# Patient Record
Sex: Male | Born: 1970 | Race: White | Hispanic: No | Marital: Married | Smoking: Former smoker
Health system: Southern US, Community
[De-identification: ages and names within clinical notes are randomized; demographics above are authoritative.]

## PROBLEM LIST (undated history)

## (undated) DIAGNOSIS — M1990 Unspecified osteoarthritis, unspecified site: Secondary | ICD-10-CM

## (undated) DIAGNOSIS — I10 Essential (primary) hypertension: Secondary | ICD-10-CM

## (undated) DIAGNOSIS — G4730 Sleep apnea, unspecified: Secondary | ICD-10-CM

## (undated) DIAGNOSIS — E785 Hyperlipidemia, unspecified: Secondary | ICD-10-CM

## (undated) DIAGNOSIS — K219 Gastro-esophageal reflux disease without esophagitis: Secondary | ICD-10-CM

## (undated) HISTORY — DX: Hyperlipidemia, unspecified: E78.5

## (undated) HISTORY — DX: Gastro-esophageal reflux disease without esophagitis: K21.9

## (undated) HISTORY — DX: Unspecified osteoarthritis, unspecified site: M19.90

## (undated) HISTORY — DX: Sleep apnea, unspecified: G47.30

## (undated) HISTORY — DX: Essential (primary) hypertension: I10

---

## 2019-02-12 ENCOUNTER — Other Ambulatory Visit: Payer: Self-pay

## 2019-02-12 ENCOUNTER — Encounter: Payer: Self-pay | Admitting: Family Medicine

## 2019-02-12 ENCOUNTER — Ambulatory Visit: Payer: 59 | Admitting: Family Medicine

## 2019-02-12 VITALS — BP 138/84 | HR 70 | Temp 98.2°F | Ht 74.0 in | Wt 297.6 lb

## 2019-02-12 DIAGNOSIS — Z87891 Personal history of nicotine dependence: Secondary | ICD-10-CM

## 2019-02-12 DIAGNOSIS — I10 Essential (primary) hypertension: Secondary | ICD-10-CM | POA: Diagnosis not present

## 2019-02-12 DIAGNOSIS — J302 Other seasonal allergic rhinitis: Secondary | ICD-10-CM | POA: Diagnosis not present

## 2019-02-12 DIAGNOSIS — Z7689 Persons encountering health services in other specified circumstances: Secondary | ICD-10-CM

## 2019-02-12 DIAGNOSIS — M1990 Unspecified osteoarthritis, unspecified site: Secondary | ICD-10-CM | POA: Diagnosis not present

## 2019-02-12 DIAGNOSIS — K219 Gastro-esophageal reflux disease without esophagitis: Secondary | ICD-10-CM | POA: Diagnosis not present

## 2019-02-12 DIAGNOSIS — E785 Hyperlipidemia, unspecified: Secondary | ICD-10-CM | POA: Diagnosis not present

## 2019-02-12 LAB — COMPREHENSIVE METABOLIC PANEL
ALT: 46 U/L (ref 0–53)
AST: 37 U/L (ref 0–37)
Albumin: 4.5 g/dL (ref 3.5–5.2)
Alkaline Phosphatase: 62 U/L (ref 39–117)
BUN: 19 mg/dL (ref 6–23)
CO2: 27 mEq/L (ref 19–32)
Calcium: 9.6 mg/dL (ref 8.4–10.5)
Chloride: 103 mEq/L (ref 96–112)
Creatinine, Ser: 1.07 mg/dL (ref 0.40–1.50)
GFR: 73.77 mL/min (ref >60.00)
Glucose, Bld: 114 mg/dL — ABNORMAL HIGH (ref 70–99)
Potassium: 4.3 mEq/L (ref 3.5–5.1)
Sodium: 137 mEq/L (ref 135–145)
Total Bilirubin: 0.7 mg/dL (ref 0.2–1.2)
Total Protein: 7.4 g/dL (ref 6.0–8.3)

## 2019-02-12 LAB — LIPID PANEL
Cholesterol: 290 mg/dL — ABNORMAL HIGH (ref 0–200)
HDL: 52.5 mg/dL (ref >39.00)
LDL Cholesterol: 213 mg/dL — ABNORMAL HIGH (ref 0–99)
NonHDL: 237.27
Total CHOL/HDL Ratio: 6
Triglycerides: 119 mg/dL (ref 0.0–149.0)
VLDL: 23.8 mg/dL (ref 0.0–40.0)

## 2019-02-12 LAB — CBC
HCT: 45.6 % (ref 39.0–52.0)
Hemoglobin: 15.1 g/dL (ref 13.0–17.0)
MCHC: 33 g/dL (ref 30.0–36.0)
MCV: 92.5 fl (ref 78.0–100.0)
Platelets: 205 10*3/uL (ref 150.0–400.0)
RBC: 4.93 Mil/uL (ref 4.22–5.81)
RDW: 13 % (ref 11.5–15.5)
WBC: 3.9 10*3/uL — ABNORMAL LOW (ref 4.0–10.5)

## 2019-02-12 MED ORDER — OMEPRAZOLE 20 MG PO CPDR: 20.0000 mg | DELAYED_RELEASE_CAPSULE | Freq: Every day | ORAL | 3 refills | Status: DC

## 2019-02-12 MED ORDER — OLMESARTAN MEDOXOMIL-HCTZ 40-25 MG PO TABS: 1.0000 | ORAL_TABLET | Freq: Every day | ORAL | 3 refills | Status: DC

## 2019-02-12 NOTE — Progress Notes (Signed)
Patient presents to clinic today to f/u on chronic conditions and establish care. Pronounced "Smee-gul-ski".  SUBJECTIVE: PMH: Pt is a 48 yo male with pmh sig for arthritis, HTN, GERD, seasonal allergies, HLD. Previously seen in [place], [place].  HTN: -needs refills.  Was on olmesartan-HCTZ 40-25 mg daily -lost rx when moving -since being out of med endorses dull nagging HA at times -was on losartan, but caused LE edema and muscle cramps -lisinopril caused dry cough -not drinking much water.  Drinking 1 L of [place] per day.  Was drinking 2-3 L.  GERD: -Taking omeprazole 20 mg as needed -In the past reflux was so bad that he had liquid melanite with acid in his mouth and nose. -Notes some improvement in symptoms -Orange juice, acidic foods, vodka will give patient symptoms -Snacking for breakfast and may eat a large dinner. -We will drink a whole container of apple juice in 1 sitting.  Arthritis: -History of arthritis in low back and bulging disc. -not taking anything for pain  Former smoker: -smoked 1/2-1.5 ppd from age [age] -quit by cutting down/cold [place]  Seasonal allergies: -symptoms once or twice a yr -may take OTC med prn  H/o HLD: -was suppose to have labs in March, but COVID pandemic happened. -eating at home more.  Has snacks for breakfast, wo't eat lunch, and has a large dinner.  Also snacks prior to bed.  Social hx: Pt is married.  He and his wife moved from [place]. [place], [place] to the area for his job.  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  Pt endorses social EtOH use. Pt denies tobacco or drug use.  Health Maintenance: Dental --[place] [facility] [facility] Immunizations --influenza vaccine 1991.  Patient does not want This visit.  Last TB test 1990 Colonoscopy --2014 Last CPE/03/2018 Dr. Trinna Balloon  Family medical history: Mom-HTN, HLD, diabetes, miscarriage Dad-alcohol abuse,  depression, drug abuse, MI, HTN Sister-Tina, alcohol abuse, asthma, drug abuse, HTN MGM-MI, HTN, Alzheimer's MDM-HTN, stroke   No past medical history on file.  No current outpatient medications on file prior to visit.   No current facility-administered medications on file prior to visit.    Not on File  No family history on file.  Social History   Socioeconomic History  . Marital status: Married    Spouse name: Not on file  . Number of children: Not on file  . Years of education: Not on file  . Highest education level: Not on file  Occupational History  . Not on file  Tobacco Use  . Smoking status: Not on file  Substance and Sexual Activity  . Alcohol use: Not on file  . Drug use: Not on file  . Sexual activity: Not on file  Other Topics Concern  . Not on file  Social History Narrative  . Not on file   Social Determinants of Health   Financial Resource Strain:   . Difficulty of Paying Living Expenses: Not on file  Food Insecurity:   . Worried About Charity fundraiser in the Last Year: Not on file  . Ran Out of Food in the Last Year: Not on file  Transportation Needs:   . Lack of Transportation (Medical): Not on file  . Lack of Transportation (Non-Medical): Not on file  Physical Activity:   . Days of Exercise per Week: Not on file  . Minutes of  Exercise per Session: Not on file  Stress:   . Feeling of Stress : Not on file  Social Connections:   . Frequency of Communication with Friends and Family: Not on file  . Frequency of Social Gatherings with Friends and Family: Not on file  . Attends Religious Services: Not on file  . Active Member of Clubs or Organizations: Not on file  . Attends Banker Meetings: Not on file  . Marital Status: Not on file  Intimate Partner Violence:   . Fear of Current or Ex-Partner: Not on file  . Emotionally Abused: Not on file  . Physically Abused: Not on file  . Sexually Abused: Not on file    ROS General:  Denies fever, chills, night sweats, changes in weight, changes in appetite HEENT: Denies headaches, ear pain, changes in vision, rhinorrhea, sore throat CV: Denies CP, palpitations, SOB, orthopnea Pulm: Denies SOB, cough, wheezing GI: Denies abdominal pain, nausea, vomiting, diarrhea, constipation GU: Denies dysuria, hematuria, frequency, vaginal discharge Msk: Denies muscle cramps, joint pains Neuro: Denies weakness, numbness, tingling Skin: Denies rashes, bruising Psych: Denies depression, anxiety, hallucinations  BP 138/84 (BP Location: Right Arm, Patient Position: Sitting, Cuff Size: Large)   Pulse 70   Temp 98.2 F (36.8 C) (Temporal)   Ht 6\' 2"  (1.88 m)   Wt 297 lb 9.6 oz (135 kg)   SpO2 96%   BMI 38.21 kg/m   Physical Exam Gen. Pleasant, well developed, well-nourished, in NAD HEENT - [place]/AT, PERRL, EOMI, no scleral icterus, no nasal drainage, pharynx without erythema or exudate. TMs normal b/l Lungs: no use of accessory muscles, CTAB, no wheezes, rales or rhonchi Cardiovascular: RRR, No r/g/m, no peripheral edema Abdomen: BS present, soft, nontender,nondistended Musculoskeletal: No deformities, moves all four extremities, no cyanosis or clubbing, normal tone Neuro:  A&Ox3, CN II-XII intact, normal gait Skin:  Warm, dry, intact, no lesions, sun damage  No results found for this or any previous visit (from the past 2160 hour(s)).  Assessment/Plan: Essential hypertension  -elevated -will restart meds.  Refill sent to pharmacy -discussed lifestyle modifications - Plan: CMP, olmesartan-hydrochlorothiazide (BENICAR HCT) 40-25 MG tablet  Hyperlipidemia, unspecified hyperlipidemia type  -lifestyle modifications - Plan: Lipid Panel  Seasonal allergies -OTC meds prn  Gastroesophageal reflux disease, unspecified whether esophagitis present -avoid foods known to cause problems -avoid eating so late at night -given handouts  - Plan: CBC (no diff), omeprazole (PRILOSEC) 20  MG capsule  Encounter to establish care -We reviewed the PMH, PSH, FH, SH, Meds and Allergies. -We provided refills for any medications we will prescribe as needed. -We addressed current concerns per orders and patient instructions. -We have asked for records for pertinent exams, studies, vaccines and notes from previous providers. -We have advised patient to follow up per instructions below.  Arthritis  -discussed supportive care - Plan: CBC (no diff)  Former smoker -smoking cessation >3 min, <10 min -encouraged to continue to refrain from smoking  F/u prn  2161, MD  This note is not being shared with the patient for the following reason: To prevent harm (release of this note would result in harm to the life or physical safety of the patient or another).

## 2019-02-12 NOTE — Patient Instructions (Addendum)
Managing Your Hypertension Hypertension is commonly called high blood pressure. This is when the force of your blood pressing against the walls of your arteries is too strong. Arteries are blood vessels that carry blood from your heart throughout your body. Hypertension forces the heart to work harder to pump blood, and may cause the arteries to become narrow or stiff. Having untreated or uncontrolled hypertension can cause heart attack, stroke, kidney disease, and other problems. What are blood pressure readings? A blood pressure reading consists of a higher number over a lower number. Ideally, your blood pressure should be below 120/80. The first ("top") number is called the systolic pressure. It is a measure of the pressure in your arteries as your heart beats. The second ("bottom") number is called the diastolic pressure. It is a measure of the pressure in your arteries as the heart relaxes. What does my blood pressure reading mean? Blood pressure is classified into four stages. Based on your blood pressure reading, your health care provider may use the following stages to determine what type of treatment you need, if any. Systolic pressure and diastolic pressure are measured in a unit called mm Hg. Normal  Systolic pressure: below 078.  Diastolic pressure: below 80. Elevated  Systolic pressure: 675-449.  Diastolic pressure: below 80. Hypertension stage 1  Systolic pressure: 201-007.  Diastolic pressure: 12-19. Hypertension stage 2  Systolic pressure: 758 or above.  Diastolic pressure: 90 or above. What health risks are associated with hypertension? Managing your hypertension is an important responsibility. Uncontrolled hypertension can lead to:  A heart attack.  A stroke.  A weakened blood vessel (aneurysm).  Heart failure.  Kidney damage.  Eye damage.  Metabolic syndrome.  Memory and concentration problems. What changes can I make to manage my  hypertension? Hypertension can be managed by making lifestyle changes and possibly by taking medicines. Your health care provider will help you make a plan to bring your blood pressure within a normal range. Eating and drinking   Eat a diet that is high in fiber and potassium, and low in salt (sodium), added sugar, and fat. An example eating plan is called the DASH (Dietary Approaches to Stop Hypertension) diet. To eat this way: ? Eat plenty of fresh fruits and vegetables. Try to fill half of your plate at each meal with fruits and vegetables. ? Eat whole grains, such as whole wheat pasta, brown rice, or whole grain bread. Fill about one quarter of your plate with whole grains. ? Eat low-fat diary products. ? Avoid fatty cuts of meat, processed or cured meats, and poultry with skin. Fill about one quarter of your plate with lean proteins such as fish, chicken without skin, beans, eggs, and tofu. ? Avoid premade and processed foods. These tend to be higher in sodium, added sugar, and fat.  Reduce your daily sodium intake. Most people with hypertension should eat less than 1,500 mg of sodium a day.  Limit alcohol intake to no more than 1 drink a day for nonpregnant women and 2 drinks a day for men. One drink equals 12 oz of beer, 5 oz of wine, or 1 oz of hard liquor. Lifestyle  Work with your health care provider to maintain a healthy body weight, or to lose weight. Ask what an ideal weight is for you.  Get at least 30 minutes of exercise that causes your heart to beat faster (aerobic exercise) most days of the week. Activities may include walking, swimming, or biking.  Include exercise  to strengthen your muscles (resistance exercise), such as weight lifting, as part of your weekly exercise routine. Try to do these types of exercises for 30 minutes at least 3 days a week.  Do not use any products that contain nicotine or tobacco, such as cigarettes and e-cigarettes. If you need help quitting,  ask your health care provider.  Control any long-term (chronic) conditions you have, such as high cholesterol or diabetes. Monitoring  Monitor your blood pressure at home as told by your health care provider. Your personal target blood pressure may vary depending on your medical conditions, your age, and other factors.  Have your blood pressure checked regularly, as often as told by your health care provider. Working with your health care provider  Review all the medicines you take with your health care provider because there may be side effects or interactions.  Talk with your health care provider about your diet, exercise habits, and other lifestyle factors that may be contributing to hypertension.  Visit your health care provider regularly. Your health care provider can help you create and adjust your plan for managing hypertension. Will I need medicine to control my blood pressure? Your health care provider may prescribe medicine if lifestyle changes are not enough to get your blood pressure under control, and if:  Your systolic blood pressure is 130 or higher.  Your diastolic blood pressure is 80 or higher. Take medicines only as told by your health care provider. Follow the directions carefully. Blood pressure medicines must be taken as prescribed. The medicine does not work as well when you skip doses. Skipping doses also puts you at risk for problems. Contact a health care provider if:  You think you are having a reaction to medicines you have taken.  You have repeated (recurrent) headaches.  You feel dizzy.  You have swelling in your ankles.  You have trouble with your vision. Get help right away if:  You develop a severe headache or confusion.  You have unusual weakness or numbness, or you feel faint.  You have severe pain in your chest or abdomen.  You vomit repeatedly.  You have trouble breathing. Summary  Hypertension is when the force of blood pumping  through your arteries is too strong. If this condition is not controlled, it may put you at risk for serious complications.  Your personal target blood pressure may vary depending on your medical conditions, your age, and other factors. For most people, a normal blood pressure is less than 120/80.  Hypertension is managed by lifestyle changes, medicines, or both. Lifestyle changes include weight loss, eating a healthy, low-sodium diet, exercising more, and limiting alcohol. This information is not intended to replace advice given to you by your health care provider. Make sure you discuss any questions you have with your health care provider. Document Released: 11/14/2011 Document Revised: 06/13/2018 Document Reviewed: 01/18/2016 Elsevier Patient Education  2020 [employer].  Food Choices for Gastroesophageal Reflux Disease, Adult When you have gastroesophageal reflux disease (GERD), the foods you eat and your eating habits are very important. Choosing the right foods can help ease the discomfort of GERD. Consider working with a diet and nutrition specialist (dietitian) to help you make healthy food choices. What general guidelines should I follow?  Eating plan  Choose healthy foods low in fat, such as fruits, vegetables, whole grains, low-fat dairy products, and lean meat, fish, and poultry.  Eat frequent, small meals instead of three large meals each day. Eat your meals slowly,  in a relaxed setting. Avoid bending over or lying down until 2-3 hours after eating.  Limit high-fat foods such as fatty meats or fried foods.  Limit your intake of oils, butter, and shortening to less than 8 teaspoons each day.  Avoid the following: ? Foods that cause symptoms. These may be different for different people. Keep a food diary to keep track of foods that cause symptoms. ? Alcohol. ? Drinking large amounts of liquid with meals. ? Eating meals during the 2-3 hours before bed.  Cook foods using  methods other than frying. This may include baking, grilling, or broiling. Lifestyle  Maintain a healthy weight. Ask your health care provider what weight is healthy for you. If you need to lose weight, work with your health care provider to do so safely.  Exercise for at least 30 minutes on 5 or more days each week, or as told by your health care provider.  Avoid wearing clothes that fit tightly around your waist and chest.  Do not use any products that contain nicotine or tobacco, such as cigarettes and e-cigarettes. If you need help quitting, ask your health care provider.  Sleep with the head of your bed raised. Use a wedge under the mattress or blocks under the bed frame to raise the head of the bed. What foods are not recommended? The items listed may not be a complete list. Talk with your dietitian about what dietary choices are best for you. Grains Pastries or quick breads with added fat. [place] toast. Vegetables Deep fried vegetables. [place] fries. Any vegetables prepared with added fat. Any vegetables that cause symptoms. For some people this may include tomatoes and tomato products, chili peppers, onions and garlic, and horseradish. Fruits Any fruits prepared with added fat. Any fruits that cause symptoms. For some people this may include citrus fruits, such as oranges, grapefruit, pineapple, and lemons. Meats and other protein foods High-fat meats, such as fatty beef or pork, hot dogs, ribs, ham, sausage, salami and bacon. Fried meat or protein, including fried fish and fried chicken. Nuts and nut butters. Dairy Whole milk and chocolate milk. Sour cream. Cream. Ice cream. Cream cheese. Milk shakes. Beverages Coffee and tea, with or without caffeine. Carbonated beverages. Sodas. Energy drinks. Fruit juice made with acidic fruits (such as orange or grapefruit). Tomato juice. Alcoholic drinks. Fats and oils Butter. Margarine. Shortening. Ghee. Sweets and desserts Chocolate and  cocoa. Donuts. Seasoning and other foods Pepper. Peppermint and spearmint. Any condiments, herbs, or seasonings that cause symptoms. For some people, this may include curry, hot sauce, or vinegar-based salad dressings. Summary  When you have gastroesophageal reflux disease (GERD), food and lifestyle choices are very important to help ease the discomfort of GERD.  Eat frequent, small meals instead of three large meals each day. Eat your meals slowly, in a relaxed setting. Avoid bending over or lying down until 2-3 hours after eating.  Limit high-fat foods such as fatty meat or fried foods. This information is not intended to replace advice given to you by your health care provider. Make sure you discuss any questions you have with your health care provider. Document Released: 02/19/2005 Document Revised: 06/12/2018 Document Reviewed: 02/21/2016 Elsevier Patient Education  2020 [employer]  High Cholesterol  High cholesterol is a condition in which the blood has high levels of a white, waxy, fat-like substance (cholesterol). The human body needs small amounts of cholesterol. The liver makes all the cholesterol that the body needs. Extra (excess) cholesterol comes  from the food that we eat. Cholesterol is carried from the liver by the blood through the blood vessels. If you have high cholesterol, deposits (plaques) may build up on the walls of your blood vessels (arteries). Plaques make the arteries narrower and stiffer. Cholesterol plaques increase your risk for heart attack and stroke. Work with your health care provider to keep your cholesterol levels in a healthy range. What increases the risk? This condition is more likely to develop in people who:  Eat foods that are high in animal fat (saturated fat) or cholesterol.  Are overweight.  Are not getting enough exercise.  Have a family history of high cholesterol. What are the signs or symptoms? There are no symptoms of this  condition. How is this diagnosed? This condition may be diagnosed from the results of a blood test.  If you are older than age 21, your health care provider may check your cholesterol every 4-6 years.  You may be checked more often if you already have high cholesterol or other risk factors for heart disease. The blood test for cholesterol measures:  "Bad" cholesterol (LDL cholesterol). This is the main type of cholesterol that causes heart disease. The desired level for LDL is less than 100.  "Good" cholesterol (HDL cholesterol). This type helps to protect against heart disease by cleaning the arteries and carrying the LDL away. The desired level for HDL is 60 or higher.  Triglycerides. These are fats that the body can store or burn for energy. The desired number for triglycerides is lower than 150.  Total cholesterol. This is a measure of the total amount of cholesterol in your blood, including LDL cholesterol, HDL cholesterol, and triglycerides. A healthy number is less than 200. How is this treated? This condition is treated with diet changes, lifestyle changes, and medicines. Diet changes  This may include eating more whole grains, fruits, vegetables, nuts, and fish.  This may also include cutting back on red meat and foods that have a lot of added sugar. Lifestyle changes  Changes may include getting at least 40 minutes of aerobic exercise 3 times a week. Aerobic exercises include walking, biking, and swimming. Aerobic exercise along with a healthy diet can help you maintain a healthy weight.  Changes may also include quitting smoking. Medicines  Medicines are usually given if diet and lifestyle changes have failed to reduce your cholesterol to healthy levels.  Your health care provider may prescribe a statin medicine. Statin medicines have been shown to reduce cholesterol, which can reduce the risk of heart disease. Follow these instructions at home: Eating and drinking If  told by your health care provider:  Eat chicken (without skin), fish, veal, shellfish, ground [place] breast, and round or loin cuts of red meat.  Do not eat fried foods or fatty meats, such as hot dogs and salami.  Eat plenty of fruits, such as apples.  Eat plenty of vegetables, such as broccoli, potatoes, and carrots.  Eat beans, peas, and lentils.  Eat grains such as barley, rice, couscous, and bulgur wheat.  Eat pasta without cream sauces.  Use skim or nonfat milk, and eat low-fat or nonfat yogurt and cheeses.  Do not eat or drink whole milk, cream, ice cream, egg yolks, or hard cheeses.  Do not eat stick margarine or tub margarines that contain trans fats (also called partially hydrogenated oils).  Do not eat saturated tropical oils, such as coconut oil and palm oil.  Do not eat cakes, cookies, crackers, or  other baked goods that contain trans fats.  General instructions  Exercise as directed by your health care provider. Increase your activity level with activities such as gardening, walking, and taking the stairs.  Take over-the-counter and prescription medicines only as told by your health care provider.  Do not use any products that contain nicotine or tobacco, such as cigarettes and e-cigarettes. If you need help quitting, ask your health care provider.  Keep all follow-up visits as told by your health care provider. This is important. Contact a health care provider if:  You are struggling to maintain a healthy diet or weight.  You need help to start on an exercise program.  You need help to stop smoking. Get help right away if:  You have chest pain.  You have trouble breathing. This information is not intended to replace advice given to you by your health care provider. Make sure you discuss any questions you have with your health care provider. Document Released: 02/19/2005 Document Revised: 02/22/2017 Document Reviewed: 08/20/2015 Elsevier Patient  Education  [place].  Arthritis Arthritis means joint pain. It can also mean joint disease. A joint is a place where bones come together. There are more than 100 types of arthritis. What are the causes? This condition may be caused by:  Wear and tear of a joint. This is the most common cause.  A lot of acid in the blood, which leads to pain in the joint (gout).  Pain and swelling (inflammation) in a joint.  Infection of a joint.  Injuries in the joint.  A reaction to medicines (allergy). In some cases, the cause may not be known. What are the signs or symptoms? Symptoms of this condition include:  Redness at a joint.  Swelling at a joint.  Stiffness at a joint.  Warmth coming from the joint.  A fever.  A feeling of being sick. How is this treated? This condition may be treated with:  Treating the cause, if it is known.  Rest.  Raising (elevating) the joint.  Putting cold or hot packs on the joint.  Medicines to treat symptoms and reduce pain and swelling.  Shots of medicines (cortisone) into the joint. You may also be told to make changes in your life, such as doing exercises and losing weight. Follow these instructions at home: Medicines  Take over-the-counter and prescription medicines only as told by your doctor.  Do not take aspirin for pain if your doctor says that you may have gout. Activity  Rest your joint if your doctor tells you to.  Avoid activities that make the pain worse.  Exercise your joint regularly as told by your doctor. Try doing exercises like: ? Swimming. ? Water aerobics. ? Biking. ? Walking. Managing pain, stiffness, and swelling      If told, put ice on the affected area. ? Put ice in a plastic bag. ? Place a towel between your skin and the bag. ? Leave the ice on for 20 minutes, 2-3 times per day.  If your joint is swollen, raise (elevate) it above the level of your heart if told by your doctor.  If your  joint feels stiff in the morning, try taking a warm shower.  If told, put heat on the affected area. Do this as often as told by your doctor. Use the heat source that your doctor recommends, such as a moist heat pack or a heating pad. If you have diabetes, do not apply heat without asking your  doctor. To apply heat: ? Place a towel between your skin and the heat source. ? Leave the heat on for 20-30 minutes. ? Remove the heat if your skin turns bright red. This is very important if you are unable to feel pain, heat, or cold. You may have a greater risk of getting burned. General instructions  Do not use any products that contain nicotine or tobacco, such as cigarettes, e-cigarettes, and chewing tobacco. If you need help quitting, ask your doctor.  Keep all follow-up visits as told by your doctor. This is important. Contact a doctor if:  The pain gets worse.  You have a fever. Get help right away if:  You have very bad pain in your joint.  You have swelling in your joint.  Your joint is red.  Many joints become painful and swollen.  You have very bad back pain.  Your leg is very weak.  You cannot control your pee (urine) or poop (stool). Summary  Arthritis means joint pain. It can also mean joint disease. A joint is a place where bones come together.  The most common cause of this condition is wear and tear of a joint.  Symptoms of this condition include redness, swelling, or stiffness of the joint.  This condition is treated with rest, raising the joint, medicines, and putting cold or hot packs on the joint.  Follow your doctor's instructions about medicines, activity, exercises, and other home care treatments. This information is not intended to replace advice given to you by your health care provider. Make sure you discuss any questions you have with your health care provider. Document Released: 05/16/2009 Document Revised: 01/27/2018 Document Reviewed:  01/27/2018 Elsevier Patient Education  2020 [employer]

## 2019-03-03 ENCOUNTER — Telehealth: Payer: Self-pay

## 2019-03-03 NOTE — Telephone Encounter (Signed)
Pt. Given results and instructions. Verbalizes understanding. Will call back to schedule lipid recheck when he has his schedule.

## 2019-08-04 ENCOUNTER — Other Ambulatory Visit: Payer: Self-pay

## 2019-08-04 ENCOUNTER — Emergency Department (HOSPITAL_COMMUNITY): Payer: 59

## 2019-08-04 ENCOUNTER — Observation Stay (HOSPITAL_COMMUNITY)
Admission: EM | Admit: 2019-08-04 | Discharge: 2019-08-05 | Disposition: A | Discharge status: Home / Self Care | Payer: 59 | Attending: Internal Medicine | Admitting: Internal Medicine

## 2019-08-04 ENCOUNTER — Encounter (HOSPITAL_COMMUNITY): Payer: Self-pay | Admitting: Emergency Medicine

## 2019-08-04 DIAGNOSIS — Z20822 Contact with and (suspected) exposure to covid-19: Secondary | ICD-10-CM | POA: Insufficient documentation

## 2019-08-04 DIAGNOSIS — K219 Gastro-esophageal reflux disease without esophagitis: Secondary | ICD-10-CM | POA: Diagnosis not present

## 2019-08-04 DIAGNOSIS — E669 Obesity, unspecified: Secondary | ICD-10-CM | POA: Insufficient documentation

## 2019-08-04 DIAGNOSIS — Z6836 Body mass index (BMI) 36.0-36.9, adult: Secondary | ICD-10-CM | POA: Insufficient documentation

## 2019-08-04 DIAGNOSIS — R55 Syncope and collapse: Principal | ICD-10-CM | POA: Insufficient documentation

## 2019-08-04 DIAGNOSIS — E785 Hyperlipidemia, unspecified: Secondary | ICD-10-CM | POA: Diagnosis not present

## 2019-08-04 DIAGNOSIS — N179 Acute kidney failure, unspecified: Secondary | ICD-10-CM | POA: Insufficient documentation

## 2019-08-04 DIAGNOSIS — R0789 Other chest pain: Secondary | ICD-10-CM | POA: Insufficient documentation

## 2019-08-04 DIAGNOSIS — Z87891 Personal history of nicotine dependence: Secondary | ICD-10-CM | POA: Insufficient documentation

## 2019-08-04 DIAGNOSIS — Z8249 Family history of ischemic heart disease and other diseases of the circulatory system: Secondary | ICD-10-CM | POA: Diagnosis not present

## 2019-08-04 DIAGNOSIS — R739 Hyperglycemia, unspecified: Secondary | ICD-10-CM | POA: Diagnosis not present

## 2019-08-04 DIAGNOSIS — I10 Essential (primary) hypertension: Secondary | ICD-10-CM | POA: Insufficient documentation

## 2019-08-04 DIAGNOSIS — Z66 Do not resuscitate: Secondary | ICD-10-CM | POA: Diagnosis not present

## 2019-08-04 DIAGNOSIS — Z79899 Other long term (current) drug therapy: Secondary | ICD-10-CM | POA: Diagnosis not present

## 2019-08-04 DIAGNOSIS — R079 Chest pain, unspecified: Secondary | ICD-10-CM

## 2019-08-04 LAB — BASIC METABOLIC PANEL
Anion gap: 18 — ABNORMAL HIGH (ref 5–15)
BUN: 26 mg/dL — ABNORMAL HIGH (ref 6–20)
CO2: 20 mmol/L — ABNORMAL LOW (ref 22–32)
Calcium: 10.3 mg/dL (ref 8.9–10.3)
Chloride: 98 mmol/L (ref 98–111)
Creatinine, Ser: 1.81 mg/dL — ABNORMAL HIGH (ref 0.61–1.24)
GFR calc Af Amer: 50 mL/min — ABNORMAL LOW (ref >60)
GFR calc non Af Amer: 43 mL/min — ABNORMAL LOW (ref >60)
Glucose, Bld: 158 mg/dL — ABNORMAL HIGH (ref 70–99)
Potassium: 4.1 mmol/L (ref 3.5–5.1)
Sodium: 136 mmol/L (ref 135–145)

## 2019-08-04 LAB — CBC
HCT: 53.4 % — ABNORMAL HIGH (ref 39.0–52.0)
Hemoglobin: 18.1 g/dL — ABNORMAL HIGH (ref 13.0–17.0)
MCH: 30.2 pg (ref 26.0–34.0)
MCHC: 33.9 g/dL (ref 30.0–36.0)
MCV: 89 fL (ref 80.0–100.0)
Platelets: 279 10*3/uL (ref 150–400)
RBC: 6 MIL/uL — ABNORMAL HIGH (ref 4.22–5.81)
RDW: 12.7 % (ref 11.5–15.5)
WBC: 10.9 10*3/uL — ABNORMAL HIGH (ref 4.0–10.5)
nRBC: 0 % (ref 0.0–0.2)

## 2019-08-04 LAB — TROPONIN I (HIGH SENSITIVITY)
Troponin I (High Sensitivity): 22 ng/L — ABNORMAL HIGH (ref <18)
Troponin I (High Sensitivity): 18 ng/L — ABNORMAL HIGH (ref <18)

## 2019-08-04 MED ORDER — SODIUM CHLORIDE 0.9 % IV SOLN: Freq: Once | INTRAVENOUS | Status: AC

## 2019-08-04 MED ORDER — SODIUM CHLORIDE 0.9% FLUSH
3.0000 mL | Freq: Once | INTRAVENOUS | Status: AC
  Administered 2019-08-05: 3 mL via INTRAVENOUS

## 2019-08-04 MED ORDER — SODIUM CHLORIDE 0.9 % IV BOLUS
1000.0000 mL | Freq: Once | INTRAVENOUS | Status: AC
  Administered 2019-08-05: 1000 mL via INTRAVENOUS

## 2019-08-04 NOTE — ED Provider Notes (Signed)
[facility] EMERGENCY DEPARTMENT Provider Note   CSN: [id number] Arrival date & time: 08/04/19  1732     History Chief Complaint  Patient presents with  . Dizziness  . Nausea  . Shortness of Breath    Anthony Bishop is a 49 y.o. male.  Anthony Bishop is a 49 y.o. male with a history of hypertension, hyperlipidemia, GERD, and arthritis, who presents to the ED for evaluation of near syncope.  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  He was able to walk himself to his car and sit down and air conditioning.  It took him about 20-25 minutes to come back to normal.  He reports that he then drove himself home, when he got home he took his blood pressure and was noted to have a 74 systolic.  He sat down and rested for a while, if he tried to get up and walk for more than a few minutes he started to feel nauseated and lightheaded once again.  He has not had any complete syncopal episodes where he is fallen down, reports that he started to develop some left-sided chest pressure when he got to the emergency department this has slowly dissipated.  He denies associated abdominal pain.  Reports he had some shortness of breath during his near syncopal episode but none since then, denies pleuritic pain.  No lower extremity swelling or pain.  Denies previous history of similar episodes.  No headache, numbness, weakness or tingling.  No other aggravating or alleviating factors.        Past Medical History:  Diagnosis Date  . Arthritis   . GERD (gastroesophageal reflux disease)   . Hyperlipidemia   . Hypertension     Patient Active Problem List   Diagnosis Date Noted    . Essential hypertension 02/12/2019  . Hyperlipidemia 02/12/2019  . Seasonal allergies 02/12/2019  . Gastroesophageal reflux disease 02/12/2019  . Arthritis 02/12/2019  . Former smoker 02/12/2019    History reviewed. No pertinent surgical history.     Family History  Problem Relation Age of Onset  . Miscarriages / [place] Mother   . Hypertension Mother   . Hyperlipidemia Mother   . Diabetes Mother   . Alcohol abuse Father   . Depression Father   . Drug abuse Father   . Heart disease Father   . Hypertension Father   . Alcohol abuse Sister   . Asthma Sister   . Drug abuse Sister   . Hypertension Sister   . Heart disease Maternal Grandmother   . Kidney disease Maternal Grandmother   . Alzheimer's disease Maternal Grandmother   . Kidney disease Maternal Grandfather   . Stroke Maternal Grandfather     Social History   Tobacco Use  . Smoking status: Former Smoker  Substance Use Topics  . Alcohol use: Yes  . Drug use: Never    Home Medications Prior to Admission medications   Medication Sig Start Date End Date Taking? Authorizing Provider  olmesartan-hydrochlorothiazide (BENICAR HCT) 40-25 MG tablet Take 1 tablet by mouth daily. 02/12/19   Deeann Saint, MD  omeprazole (PRILOSEC) 20 MG capsule Take 1 capsule (20  mg total) by mouth daily. 02/12/19   Deeann Saint, MD    Allergies    Augmentin [amoxicillin-pot clavulanate], Lisinopril, and Losartan  Review of Systems   Review of Systems  Constitutional: Negative for chills and fever.  HENT: Negative.   Eyes: Negative for visual disturbance.  Respiratory: Negative for cough and shortness of breath.   Cardiovascular: Positive for chest pain. Negative for palpitations and leg swelling.  Gastrointestinal: Positive for nausea. Negative for abdominal pain, constipation, diarrhea and vomiting.  Genitourinary: Negative for dysuria and frequency.  Musculoskeletal: Negative for arthralgias and myalgias.  Skin:  Negative for color change and rash.  Neurological: Positive for weakness (Generalized) and light-headedness. Negative for syncope and numbness.  All other systems reviewed and are negative.   Physical Exam Updated Vital Signs BP (!) 146/93 (BP Location: Right Arm)   Pulse 86   Temp 98.2 F (36.8 C) (Oral)   Resp 20   Ht 6\' 2"  (1.88 m)   Wt 134.7 kg   SpO2 96%   BMI 38.13 kg/m   Physical Exam Vitals and nursing note reviewed.  Constitutional:      General: He is not in acute distress.    Appearance: He is well-developed. He is obese. He is not ill-appearing or diaphoretic.     Comments: Well-appearing and in no distress  HENT:     Head: Normocephalic and atraumatic.  Eyes:     General:        Right eye: No discharge.        Left eye: No discharge.     Pupils: Pupils are equal, round, and reactive to light.  Cardiovascular:     Rate and Rhythm: Normal rate and regular rhythm.     Heart sounds: Normal heart sounds. No murmur. No friction rub. No gallop.   Pulmonary:     Effort: Pulmonary effort is normal. No respiratory distress.     Breath sounds: Normal breath sounds. No wheezing or rales.     Comments: Respirations equal and unlabored, patient able to speak in full sentences, lungs clear to auscultation bilaterally Chest:     Chest wall: No tenderness.  Abdominal:     General: Bowel sounds are normal. There is no distension.     Palpations: Abdomen is soft. There is no mass.     Tenderness: There is no abdominal tenderness. There is no guarding.     Comments: Abdomen soft, nondistended, nontender to palpation in all quadrants without guarding or peritoneal signs  Musculoskeletal:        General: No deformity.     Cervical back: Neck supple.     Right lower leg: No tenderness. No edema.     Left lower leg: No tenderness. No edema.  Skin:    General: Skin is warm and dry.     Capillary Refill: Capillary refill takes less than 2 seconds.  Neurological:     Mental  Status: He is alert.     Coordination: Coordination normal.     Comments: Speech is clear, able to follow commands CN III-XII intact Normal strength in upper and lower extremities bilaterally including dorsiflexion and plantar flexion, strong and equal grip strength Sensation normal to light and sharp touch Moves extremities without ataxia, coordination intact  Psychiatric:        Mood and Affect: Mood normal.        Behavior: Behavior normal.     ED Results / Procedures / Treatments   Labs (all labs  ordered are listed, but only abnormal results are displayed) Labs Reviewed  BASIC METABOLIC PANEL - Abnormal; Notable for the following components:      Result Value   CO2 20 (*)    Glucose, Bld 158 (*)    BUN 26 (*)    Creatinine, Ser 1.81 (*)    GFR calc non Af Amer 43 (*)    GFR calc Af Amer 50 (*)    Anion gap 18 (*)    All other components within normal limits  CBC - Abnormal; Notable for the following components:   WBC 10.9 (*)    RBC 6.00 (*)    Hemoglobin 18.1 (*)    HCT 53.4 (*)    All other components within normal limits  TROPONIN I (HIGH SENSITIVITY) - Abnormal; Notable for the following components:   Troponin I (High Sensitivity) 22 (*)    All other components within normal limits  TROPONIN I (HIGH SENSITIVITY) - Abnormal; Notable for the following components:   Troponin I (High Sensitivity) 18 (*)    All other components within normal limits  SARS CORONAVIRUS 2 BY RT PCR (HOSPITAL ORDER, [place] [facility])  HIV ANTIBODY (ROUTINE TESTING W REFLEX)  HEMOGLOBIN A1C  COMPREHENSIVE METABOLIC PANEL    EKG EKG Interpretation  Date/Time:  Tuesday August 04 2019 17:40:57 EDT Ventricular Rate:  111 PR Interval:  152 QRS Duration: 78 QT Interval:  314 QTC Calculation: 427 R Axis:   15 Text Interpretation: Sinus tachycardia Possible Anterior infarct , age undetermined T wave abnormality, consider inferior ischemia Abnormal ECG No old tracing to  compare Confirmed by Ward, Cyril Mourning [phone]) on 08/04/2019 11:20:11 PM   Radiology DG Chest 2 View  Result Date: 08/04/2019 CLINICAL DATA:  Shortness of breath, chest pain EXAM: CHEST - 2 VIEW COMPARISON:  None. FINDINGS: Heart and mediastinal contours are within normal limits. No focal opacities or effusions. No acute bony abnormality. IMPRESSION: No active cardiopulmonary disease. Electronically Signed   By: Rolm Baptise M.D.   On: 08/04/2019 19:50    Procedures Procedures (including critical care time)  Medications Ordered in ED Medications  sodium chloride flush (NS) 0.9 % injection 3 mL (has no administration in time range)    ED Course  I have reviewed the triage vital signs and the nursing notes.  Pertinent labs & imaging results that were available during my care of the patient were reviewed by me and considered in my medical decision making (see chart for details).    MDM Rules/Calculators/A&P                     49 year old male presents with near syncopal episode today, when he got home he had a systolic blood pressure of 74 and has had persistent near syncopal episodes with any persistent standing or walking.  Developed some left-sided chest pain.  No prior history of the same.  On arrival he was mildly tachycardic, but vitals otherwise stable.  Chest pain has slowly improved.  No continued shortness of breath, no pleuritic pain, no lower extremity pain or swelling.  No abdominal pain, fevers, had nausea during near syncopal episode but no recent vomiting or diarrhea.  Labs, troponin, EKG and chest x-ray initiated from triage.  I have independently ordered, reviewed and interpreted all labs and imaging: CBC: Mild leukocytosis of 10.9, elevated hemoglobin of 18.1, suggestive of hemoconcentration BMP: Creatinine 1.81, was previously 1.07, GFR of 43, anion gap of 18, BUN slightly elevated  but no other significant electrolyte derangements.  Consistent with AKI in the setting of  recurrent near syncopal symptoms. Troponin: Initially 22, delta of 18 EKG: Sinus tachycardia, nonspecific T wave changes without prior comparison available. CXR: No active cardiopulmonary disease  Given new AKI with near syncopal symptoms today as well as episode of chest pain feel patient would benefit from admission, patient given IV fluid bolus and started on maintenance fluids will need monitoring of kidney function, and likely cycling of cardiac enzymes.  Case discussed with Dr. Allena Katz with [facility] who will see and admit the patient  Final Clinical Impression(s) / ED Diagnoses Final diagnoses:  AKI (acute kidney injury) [facility])  Near syncope    Rx / DC Orders ED Discharge Orders    None       Legrand Rams 08/05/19 0043    Ward, Layla Maw, DO 08/05/19 0129

## 2019-08-04 NOTE — ED Triage Notes (Signed)
Patient arrives to ED with complaints of an episode of nausea, diaphoresis, and lightheadedness and shortness of breath today while at work. Patient states he was digging a hole when it started. Patient states that now he feels pressure in his chest.

## 2019-08-05 ENCOUNTER — Other Ambulatory Visit (HOSPITAL_COMMUNITY): Payer: 59

## 2019-08-05 ENCOUNTER — Encounter (HOSPITAL_COMMUNITY): Payer: Self-pay | Admitting: Internal Medicine

## 2019-08-05 DIAGNOSIS — R55 Syncope and collapse: Secondary | ICD-10-CM | POA: Diagnosis present

## 2019-08-05 DIAGNOSIS — R079 Chest pain, unspecified: Secondary | ICD-10-CM

## 2019-08-05 DIAGNOSIS — R739 Hyperglycemia, unspecified: Secondary | ICD-10-CM

## 2019-08-05 DIAGNOSIS — N179 Acute kidney failure, unspecified: Secondary | ICD-10-CM

## 2019-08-05 LAB — GLUCOSE, CAPILLARY: Glucose-Capillary: 149 mg/dL — ABNORMAL HIGH (ref 70–99)

## 2019-08-05 LAB — CBC
HCT: 46.3 % (ref 39.0–52.0)
Hemoglobin: 15.8 g/dL (ref 13.0–17.0)
MCH: 30.7 pg (ref 26.0–34.0)
MCHC: 34.1 g/dL (ref 30.0–36.0)
MCV: 90.1 fL (ref 80.0–100.0)
Platelets: 238 10*3/uL (ref 150–400)
RBC: 5.14 MIL/uL (ref 4.22–5.81)
RDW: 13.1 % (ref 11.5–15.5)
WBC: 9.4 10*3/uL (ref 4.0–10.5)
nRBC: 0 % (ref 0.0–0.2)

## 2019-08-05 LAB — COMPREHENSIVE METABOLIC PANEL
ALT: 35 U/L (ref 0–44)
AST: 29 U/L (ref 15–41)
Albumin: 3.6 g/dL (ref 3.5–5.0)
Alkaline Phosphatase: 66 U/L (ref 38–126)
Anion gap: 12 (ref 5–15)
BUN: 29 mg/dL — ABNORMAL HIGH (ref 6–20)
CO2: 23 mmol/L (ref 22–32)
Calcium: 9.7 mg/dL (ref 8.9–10.3)
Chloride: 104 mmol/L (ref 98–111)
Creatinine, Ser: 1.24 mg/dL (ref 0.61–1.24)
GFR calc Af Amer: >60 mL/min (ref >60)
GFR calc non Af Amer: >60 mL/min (ref >60)
Glucose, Bld: 156 mg/dL — ABNORMAL HIGH (ref 70–99)
Potassium: 3.6 mmol/L (ref 3.5–5.1)
Sodium: 139 mmol/L (ref 135–145)
Total Bilirubin: 1.2 mg/dL (ref 0.3–1.2)
Total Protein: 7 g/dL (ref 6.5–8.1)

## 2019-08-05 LAB — HEMOGLOBIN A1C
Hgb A1c MFr Bld: 6.1 % — ABNORMAL HIGH (ref 4.8–5.6)
Mean Plasma Glucose: 128.37 mg/dL

## 2019-08-05 LAB — SARS CORONAVIRUS 2 BY RT PCR (HOSPITAL ORDER, PERFORMED IN ~~LOC~~ HOSPITAL LAB): SARS Coronavirus 2: NEGATIVE

## 2019-08-05 LAB — HIV ANTIBODY (ROUTINE TESTING W REFLEX): HIV Screen 4th Generation wRfx: NONREACTIVE

## 2019-08-05 MED ORDER — PANTOPRAZOLE SODIUM 40 MG PO TBEC
40.0000 mg | DELAYED_RELEASE_TABLET | Freq: Every day | ORAL | Status: DC
  Administered 2019-08-05: 40 mg via ORAL
  Filled 2019-08-05: qty 1

## 2019-08-05 MED ORDER — ACETAMINOPHEN 650 MG RE SUPP: 650.0000 mg | Freq: Four times a day (QID) | RECTAL | Status: DC | PRN

## 2019-08-05 MED ORDER — HEPARIN SODIUM (PORCINE) 5000 UNIT/ML IJ SOLN
5000.0000 [IU] | Freq: Three times a day (TID) | INTRAMUSCULAR | Status: DC
  Administered 2019-08-05: 5000 [IU] via SUBCUTANEOUS
  Filled 2019-08-05: qty 1

## 2019-08-05 MED ORDER — IRBESARTAN 300 MG PO TABS: 300.0000 mg | ORAL_TABLET | Freq: Every day | ORAL | Status: DC

## 2019-08-05 MED ORDER — SODIUM CHLORIDE 0.9% FLUSH
3.0000 mL | Freq: Two times a day (BID) | INTRAVENOUS | Status: DC
  Administered 2019-08-05: 3 mL via INTRAVENOUS

## 2019-08-05 MED ORDER — ONDANSETRON HCL 4 MG/2ML IJ SOLN: 4.0000 mg | Freq: Four times a day (QID) | INTRAMUSCULAR | Status: DC | PRN

## 2019-08-05 MED ORDER — OMEPRAZOLE 20 MG PO CPDR
20.0000 mg | DELAYED_RELEASE_CAPSULE | Freq: Every day | ORAL | Status: DC | PRN
Start: 2019-08-05 — End: 2020-02-29

## 2019-08-05 MED ORDER — ACETAMINOPHEN 325 MG PO TABS: 650.0000 mg | ORAL_TABLET | Freq: Four times a day (QID) | ORAL | Status: DC | PRN

## 2019-08-05 MED ORDER — ONDANSETRON HCL 4 MG PO TABS: 4.0000 mg | ORAL_TABLET | Freq: Four times a day (QID) | ORAL | Status: DC | PRN

## 2019-08-05 MED ORDER — OLMESARTAN MEDOXOMIL 40 MG PO TABS
40.0000 mg | ORAL_TABLET | Freq: Every day | ORAL | 0 refills | Status: DC
Start: 2019-08-05 — End: 2020-01-21

## 2019-08-05 MED ORDER — SODIUM CHLORIDE 0.9 % IV SOLN: INTRAVENOUS | Status: AC

## 2019-08-05 NOTE — Progress Notes (Signed)
Pt given discharge summary and discharged via friend as transportation.  

## 2019-08-05 NOTE — H&P (Signed)
History and Physical    Anthony Bishop [facility] MBE:[id number] DOB: 02/19/1971 DOA: 08/04/2019  PCP: Deeann Saint, MD  Patient coming from: Home  I have personally briefly reviewed patient's old medical records in [facility]  Chief Complaint: Near syncope  HPI: Anthony Bishop is a 49 y.o. male with medical history significant for hypertension, hyperlipidemia, and GERD who presents to the ED for evaluation of a near syncopal episode.  Patient states he was in his usual state of health until morning of 08/04/2019.  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  He says he was in the shade and not in direct sunlight.  After about 45 minutes of digging he began to have muscle cramping in his shoulders and his upper back.  He then became very diaphoretic, nauseous, lightheaded, and felt as if his vision turned white.  He went to sit down in his truck and turned up the [facility] and it took about 25 minutes before he began to feel better.  He did have an episode of emesis.  While he was driving home he began to have left-sided chest pressure sensation described as a bubble.  He thought this was initially due to his GERD/acid reflux.  At home he did have recurrent symptoms of lightheadedness (described as feeling off balance without room spinning sensation) after standing up and walking for couple minutes.  He had similar diaphoresis and nausea.  He was able to check his blood pressure with his home wrist BP cuff and had a reading of 70/40s.  He says he typically has BP reads in the normotensive range with his cuff.  He denies any recent changes in medications.  He does take olmesartan-HCTZ at night and to take it the night of 08/03/2019.  He also reports increased urinary frequency and thirst the last couple days.  He reports a significant history of heart disease in members of his family on his mother side.  ED Course:  Initial vitals showed BP  137/110, pulse 106, RR 18, temp 99.4, SPO2 98% on room air.  Labs are notable for creatinine 1.81 (1.07 on 02/12/2019), sodium 136, potassium 4.1, bicarb 20, BUN 26, serum glucose 158, anion gap 18, WBC 10.9, hemoglobin 18.1, platelets 279,000, high-sensitivity troponin I 22 >> 18.  2 view chest x-ray personally reviewed and negative for focal consolidation, edema, or effusion.  EKG shows sinus tachycardia, TWI  lead III with nonspecific T wave changes in leads II, aVF, and V5-V6.  No prior for comparison.  Patient was given 1 L normal saline and started on maintenance IV fluids.  The hospitalist service was consulted to admit for further evaluation management.  Review of Systems: All systems reviewed and are negative except as documented in history of present illness above.   Past Medical History:  Diagnosis Date  . Arthritis   . GERD (gastroesophageal reflux disease)   . Hyperlipidemia   . Hypertension     History reviewed. No pertinent surgical history.  Social History:  reports that he has quit smoking. He does not have any smokeless tobacco history on file. He reports current alcohol use. He reports that he does not use drugs.  Allergies  Allergen Reactions  . Augmentin [Amoxicillin-Pot Clavulanate] Nausea And Vomiting  . Lisinopril Other (See Comments)    unknown  . Losartan Other (See Comments)    unknown    Family History  Problem Relation Age of Onset  .  Miscarriages / [place] Mother   . Hypertension Mother   . Hyperlipidemia Mother   . Diabetes Mother   . Alcohol abuse Father   . Depression Father   . Drug abuse Father   . Heart disease Father   . Hypertension Father   . Alcohol abuse Sister   . Asthma Sister   . Drug abuse Sister   . Hypertension Sister   . Heart disease Maternal Grandmother   . Kidney disease Maternal Grandmother   . Alzheimer's disease Maternal Grandmother   . Kidney disease Maternal Grandfather   . Stroke Maternal Grandfather        Prior to Admission medications   Medication Sig Start Date End Date Taking? Authorizing Provider  olmesartan-hydrochlorothiazide (BENICAR HCT) 40-25 MG tablet Take 1 tablet by mouth daily. Patient taking differently: Take 1 tablet by mouth at bedtime.  02/12/19  Yes Deeann Saint, MD  omeprazole (PRILOSEC) 20 MG capsule Take 1 capsule (20 mg total) by mouth daily. Patient taking differently: Take 20 mg by mouth daily as needed (for acid reflux).  02/12/19  Yes Deeann Saint, MD    Physical Exam: Vitals:   08/04/19 1743 08/04/19 1957 08/04/19 2231  BP: (!) 137/110 135/86 (!) 146/93  Pulse: (!) 106 (!) 102 86  Resp: 18 18 20   Temp: 99.4 F (37.4 C) 98.9 F (37.2 C) 98.2 F (36.8 C)  TempSrc: Oral Oral Oral  SpO2: 98% 97% 96%  Weight: 134.7 kg    Height: 6\' 2"  (1.88 m)     Constitutional: Resting in bed with head elevated, NAD, calm, comfortable Eyes: PERRL, EOMI, lids and conjunctivae normal ENMT: Mucous membranes are moist. Posterior pharynx clear of any exudate or lesions.Normal dentition.  Neck: normal, supple, no masses. Respiratory: clear to auscultation bilaterally, no wheezing, no crackles. Normal respiratory effort. No accessory muscle use.  Cardiovascular: Regular rate and rhythm, no murmurs / rubs / gallops. No extremity edema. 2+ pedal pulses. Abdomen: no tenderness, no masses palpated. No hepatosplenomegaly. Bowel sounds positive.  Musculoskeletal: no clubbing / cyanosis. No joint deformity upper and lower extremities. Good ROM, no contractures. Normal muscle tone.  Skin: no rashes, lesions, ulcers. No induration Neurologic: CN 2-12 grossly intact. Sensation intact, Strength 5/5 in all 4.  Psychiatric: Normal judgment and insight. Alert and oriented x 3. Normal mood.     Labs on Admission: I have personally reviewed following labs and imaging studies  CBC: Recent Labs  Lab 08/04/19 1759  WBC 10.9*  HGB 18.1*  HCT 53.4*  MCV 89.0  PLT 279    Basic Metabolic Panel: Recent Labs  Lab 08/04/19 1759  NA 136  K 4.1  CL 98  CO2 20*  GLUCOSE 158*  BUN 26*  CREATININE 1.81*  CALCIUM 10.3   GFR: Estimated Creatinine Clearance: 72.9 mL/min (A) (by C-G formula based on SCr of 1.81 mg/dL (H)). Liver Function Tests: No results for input(s): AST, ALT, ALKPHOS, BILITOT, PROT, ALBUMIN in the last 168 hours. No results for input(s): LIPASE, AMYLASE in the last 168 hours. No results for input(s): AMMONIA in the last 168 hours. Coagulation Profile: No results for input(s): INR, PROTIME in the last 168 hours. Cardiac Enzymes: No results for input(s): CKTOTAL, CKMB, CKMBINDEX, TROPONINI in the last 168 hours. BNP (last 3 results) No results for input(s): PROBNP in the last 8760 hours. HbA1C: No results for input(s): HGBA1C in the last 72 hours. CBG: No results for input(s): GLUCAP in the last 168 hours. Lipid Profile:  No results for input(s): CHOL, HDL, LDLCALC, TRIG, CHOLHDL, LDLDIRECT in the last 72 hours. Thyroid Function Tests: No results for input(s): TSH, T4TOTAL, FREET4, T3FREE, THYROIDAB in the last 72 hours. Anemia Panel: No results for input(s): VITAMINB12, FOLATE, FERRITIN, TIBC, IRON, RETICCTPCT in the last 72 hours. Urine analysis: No results found for: COLORURINE, APPEARANCEUR, LABSPEC, [place], GLUCOSEU, HGBUR, BILIRUBINUR, KETONESUR, PROTEINUR, UROBILINOGEN, NITRITE, LEUKOCYTESUR  Radiological Exams on Admission: DG Chest 2 View  Result Date: 08/04/2019 CLINICAL DATA:  Shortness of breath, chest pain EXAM: CHEST - 2 VIEW COMPARISON:  None. FINDINGS: Heart and mediastinal contours are within normal limits. No focal opacities or effusions. No acute bony abnormality. IMPRESSION: No active cardiopulmonary disease. Electronically Signed   By: Rolm Baptise M.D.   On: 08/04/2019 19:50    EKG: Independently reviewed.  Sinus tachycardia, TWI lead III with nonspecific T wave changes in leads II, aVF, and V5-V6.  No prior  for comparison.  Assessment/Plan Principal Problem:   Near syncope Active Problems:   Essential hypertension   AKI (acute kidney injury) ([place])   Hyperglycemia   Chest pain  imothy Kruze Atchley is a 49 y.o. male with medical history significant for hypertension, hyperlipidemia, and GERD who is admitted with near syncopal episode and AKI.  Near-syncope: History suggestive of vasovagal near syncopal episode.  Possibly also orthostatic.  Likely mediated from dehydration and exertion. -Obtain orthostatic vital signs -Continue IV fluid hydration overnight -Monitor on telemetry -Hold home olmesartan-HCTZ -Obtain echocardiogram  Acute kidney injury: Likely prerenal from dehydration and hypotensive episode in setting of olmesartan-HCTZ use. Continue IV fluid hydration overnight and repeat labs in the morning -Holding home olmesartan-HCTZ  Chest pain: Patient with atypical transient left-sided chest pressure/bubble sensation which resolved on its own.  Suspect GERD/indigestion etiology however patient does report significant cardiac history in his family.  EKG showed sinus tachycardia with nonspecific T wave changes.  Cardiac enzymes minimally elevated at 22 with downtrend to 18 on repeat.  He is chest pain-free at this time. -Repeat EKG in the morning -Echocardiogram as above  Hypertension: Holding home olmesartan-HCTZ in setting of near syncopal episode and AKI.  Hyperglycemia: Serum glucose 158 on admission.  Patient does report recent polyuria and polydipsia.  Will obtain A1c to assess glycemic control.  GERD: Continue PPI.  DVT prophylaxis: Subcutaneous heparin Code Status: DNR, confirmed with patient Family Communication: Discussed with patient's wife at bedside Disposition Plan: From home and likely discharge to home pending improvement in renal function and near syncope work-up Consults called: None Admission status:  Status is: Observation  The patient remains OBS  appropriate and will d/c before 2 midnights.  Dispo: The patient is from: Home              Anticipated d/c is to: Home              Anticipated d/c date is: 1 day              Patient currently is not medically stable to d/c.    Zada Finders MD [facility]  If 7PM-7AM, please contact night-coverage [email or web address]  08/05/2019, 12:08 AM

## 2019-08-05 NOTE — Progress Notes (Signed)
Patient placed in observation with suspected dehydration (on diuretic as well as works outside).  Orthostatics never done.  Labs this AM are still pending.  If CR improved and orthostatics negative suspect can be d/c'd later today with change in BP management. JV

## 2019-08-05 NOTE — Discharge Summary (Signed)
Physician Discharge Summary  [name] TLX:[id number] DOB: Dec 16, 1970 DOA: 08/04/2019  PCP: Deeann Saint, MD  Admit date: 08/04/2019 Discharge date: 08/05/2019  Admitted From: Home Discharge disposition: Home   Recommendations for Outpatient Follow-Up:   Diuretic removed from patient's blood pressure regimen BMP 1 week Patient's hemoglobin A1c was 6.1: Patient instructed to make lifestyle changes and follow-up with PCP for medication if needed Outpatient follow-up for referral to cardiology if continues to have any chest discomfort (cardiac enzymes negative here)  Discharge Diagnosis:   Principal Problem:   Near syncope Active Problems:   Essential hypertension   AKI (acute kidney injury) (HCC)   Hyperglycemia   Chest pain    Discharge Condition: Improved.  Diet recommendation: Low sodium, heart healthy.  Wound care: None.  Code status: Full.   History of Present Illness:   Anthony Bishop is a 49 y.o. male with medical history significant for hypertension, hyperlipidemia, and GERD who presents to the ED for evaluation of a near syncopal episode.  Patient states he was in his usual state of health until morning of 08/04/2019.  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  He says he was in the shade and not in direct sunlight.  After about 45 minutes of digging he began to have muscle cramping in his shoulders and his upper back.  He then became very diaphoretic, nauseous, lightheaded, and felt as if his vision turned white.  He went to sit down in his truck and turned up the [facility] and it took about 25 minutes before he began to feel better.  He did have an episode of emesis.  While he was driving home he began to have left-sided chest pressure sensation described as a bubble.  He thought this was initially due to his GERD/acid reflux.  At home he did have recurrent symptoms of lightheadedness  (described as feeling off balance without room spinning sensation) after standing up and walking for couple minutes.  He had similar diaphoresis and nausea.  He was able to check his blood pressure with his home wrist BP cuff and had a reading of 70/40s.  He says he typically has BP reads in the normotensive range with his cuff.  He denies any recent changes in medications.  He does take olmesartan-HCTZ at night and to take it the night of 08/03/2019.  He also reports increased urinary frequency and thirst the last couple days.  He reports a significant history of heart disease in members of his family on his mother side.   Hospital Course by Problem:   Hypotension -due to dehydration  Acute kidney injury: Resolved, due most likely to medications as well as dehydration  Hyperglycemia -Hemoglobin A1c 6.1 -Patient's been on metformin the past and did not tolerate it due to GI side effects -We will defer to PCP for further medications    Medical Consultants:      Discharge Exam:   Vitals:   08/05/19 0253 08/05/19 0803  BP: (!) 167/111 122/81  Pulse: 70 77  Resp:  18  Temp: 97.8 F (36.6 C) 98.6 F (37 C)  SpO2: 100% 96%   Vitals:   08/05/19 0100 08/05/19 0253 08/05/19 0528 08/05/19 0803  BP: (!) 145/97 (!) 167/111  122/81  Pulse:  70  77  Resp: 18   18  Temp:  97.8 F (36.6 C)  98.6 F (37 C)  TempSrc:  Oral  Oral  SpO2:  100%  96%  Weight:   129.4 kg   Height:        General exam: Appears calm and comfortable.   The results of significant diagnostics from this hospitalization (including imaging, microbiology, ancillary and laboratory) are listed below for reference.     Procedures and Diagnostic Studies:   DG Chest 2 View  Result Date: 08/04/2019 CLINICAL DATA:  Shortness of breath, chest pain EXAM: CHEST - 2 VIEW COMPARISON:  None. FINDINGS: Heart and mediastinal contours are within normal limits. No focal opacities or effusions. No acute bony abnormality.  IMPRESSION: No active cardiopulmonary disease. Electronically Signed   By: Rolm Baptise M.D.   On: 08/04/2019 19:50     Labs:   Basic Metabolic Panel: Recent Labs  Lab 08/04/19 1759 08/05/19 0756  NA 136 139  K 4.1 3.6  CL 98 104  CO2 20* 23  GLUCOSE 158* 156*  BUN 26* 29*  CREATININE 1.81* 1.24  CALCIUM 10.3 9.7   GFR Estimated Creatinine Clearance: 104.2 mL/min (by C-G formula based on SCr of 1.24 mg/dL). Liver Function Tests: Recent Labs  Lab 08/05/19 0756  AST 29  ALT 35  ALKPHOS 66  BILITOT 1.2  PROT 7.0  ALBUMIN 3.6   No results for input(s): LIPASE, AMYLASE in the last 168 hours. No results for input(s): AMMONIA in the last 168 hours. Coagulation profile No results for input(s): INR, PROTIME in the last 168 hours.  CBC: Recent Labs  Lab 08/04/19 1759 08/05/19 0756  WBC 10.9* 9.4  HGB 18.1* 15.8  HCT 53.4* 46.3  MCV 89.0 90.1  PLT 279 238   Cardiac Enzymes: No results for input(s): CKTOTAL, CKMB, CKMBINDEX, TROPONINI in the last 168 hours. BNP: Invalid input(s): POCBNP CBG: Recent Labs  Lab 08/05/19 0526  GLUCAP 149*   D-Dimer No results for input(s): DDIMER in the last 72 hours. Hgb A1c Recent Labs    08/05/19 0756  HGBA1C 6.1*   Lipid Profile No results for input(s): CHOL, HDL, LDLCALC, TRIG, CHOLHDL, LDLDIRECT in the last 72 hours. Thyroid function studies No results for input(s): TSH, T4TOTAL, T3FREE, THYROIDAB in the last 72 hours.  Invalid input(s): FREET3 Anemia work up No results for input(s): VITAMINB12, FOLATE, FERRITIN, TIBC, IRON, RETICCTPCT in the last 72 hours. Microbiology Recent Results (from the past 240 hour(s))  SARS Coronavirus 2 by RT PCR (hospital order, performed in [facility] hospital lab) Nasopharyngeal Nasopharyngeal Swab     Status: None   Collection Time: 08/05/19 12:37 AM   Specimen: Nasopharyngeal Swab  Result Value Ref Range Status   SARS Coronavirus 2 NEGATIVE NEGATIVE Final    Comment:  (NOTE) SARS-CoV-2 target nucleic acids are NOT DETECTED. The SARS-CoV-2 RNA is generally detectable in upper and lower respiratory specimens during the acute phase of infection. The lowest concentration of SARS-CoV-2 viral copies this assay can detect is 250 copies / mL. A negative result does not preclude SARS-CoV-2 infection and should not be used as the sole basis for treatment or other patient management decisions.  A negative result may occur with improper specimen collection / handling, submission of specimen other than nasopharyngeal swab, presence of viral mutation(s) within the areas targeted by this assay, and inadequate number of viral copies (<250 copies / mL). A negative result must be combined with clinical observations, patient history, and epidemiological information. Fact Sheet for Patients:   StrictlyIdeas.no Fact Sheet for Healthcare Providers: BankingDealers.co.za This test is not yet approved or  cleared  by the [place] and has been authorized for detection and/or diagnosis of SARS-CoV-2 by FDA under an Emergency Use Authorization (EUA).  This EUA will remain in effect (meaning this test can be used) for the duration of the COVID-19 declaration under Section 564(b)(1) of the Act, 21 U.S.C. section 360bbb-3(b)(1), unless the authorization is terminated or revoked sooner. Performed at [facility], [address] [place], [place] [address]      Discharge Instructions:   Discharge Instructions    Diet - low sodium heart healthy   Complete by: As directed    Diet Carb Modified   Complete by: As directed    Increase activity slowly   Complete by: As directed      Allergies as of 08/05/2019      Reactions   Augmentin [amoxicillin-pot Clavulanate] Nausea And Vomiting   Lisinopril Other (See Comments)   unknown   Losartan Other (See Comments)   unknown      Medication List    STOP taking these  medications   olmesartan-hydrochlorothiazide 40-25 MG tablet Commonly known as: BENICAR HCT     TAKE these medications   olmesartan 40 MG tablet Commonly known as: BENICAR Take 1 tablet (40 mg total) by mouth daily.   omeprazole 20 MG capsule Commonly known as: PRILOSEC Take 1 capsule (20 mg total) by mouth daily as needed (for acid reflux).         Time coordinating discharge: 25 min  Signed:  Joseph Art DO  [facility] 08/05/2019, 10:26 AM

## 2019-08-06 ENCOUNTER — Telehealth: Payer: Self-pay | Admitting: *Deleted

## 2019-08-06 NOTE — Telephone Encounter (Signed)
Transition Care Management Follow-up Telephone Call   Date discharged? 08/05/2019   How have you been since you were released from the hospital? "I'm Doing okay. Drinking a lot of fluids"    Do you understand why you were in the hospital? yes   Do you understand the discharge instructions? yes   Where were you discharged to? Home   Items Reviewed:  Medications reviewed: yes  Allergies reviewed: yes  Dietary changes reviewed: yes  Referrals reviewed: N/A    Functional Questionnaire:   Activities of Daily Living (ADLs):   He states they are independent in the following: ambulation, bathing and hygiene, feeding, continence, grooming, toileting and dressing States they require assistance with the following: N/A    Any transportation issues/concerns?: no   Any patient concerns? no   Confirmed importance and date/time of follow-up visits scheduled yes  Provider Appointment booked with Dr. Salomon Fick 08/12/2019   Confirmed with patient if condition begins to worsen call PCP or go to the ER.  Patient was given the office number and encouraged to call back with question or concerns.  : yes

## 2019-08-11 ENCOUNTER — Other Ambulatory Visit: Payer: Self-pay

## 2019-08-12 ENCOUNTER — Encounter: Payer: Self-pay | Admitting: Family Medicine

## 2019-08-12 ENCOUNTER — Ambulatory Visit (INDEPENDENT_AMBULATORY_CARE_PROVIDER_SITE_OTHER): Payer: 59 | Admitting: Family Medicine

## 2019-08-12 VITALS — Temp 97.4°F | Wt 285.0 lb

## 2019-08-12 DIAGNOSIS — N179 Acute kidney failure, unspecified: Secondary | ICD-10-CM

## 2019-08-12 DIAGNOSIS — I10 Essential (primary) hypertension: Secondary | ICD-10-CM

## 2019-08-12 DIAGNOSIS — R7303 Prediabetes: Secondary | ICD-10-CM | POA: Diagnosis not present

## 2019-08-12 DIAGNOSIS — G4733 Obstructive sleep apnea (adult) (pediatric): Secondary | ICD-10-CM

## 2019-08-12 DIAGNOSIS — Z09 Encounter for follow-up examination after completed treatment for conditions other than malignant neoplasm: Secondary | ICD-10-CM

## 2019-08-12 DIAGNOSIS — Z9989 Dependence on other enabling machines and devices: Secondary | ICD-10-CM

## 2019-08-12 NOTE — Progress Notes (Signed)
Subjective:    Patient ID: Anthony Bishop, male    DOB: 1970/04/23, 49 y.o.   MRN: [id number]  No chief complaint on file.   HPI Patient was seen today for HFU.  Pt admitted 6/1-6/2/202 for near syncopal episode 2/2 dehydration complicated by diuretic use.  Pt given IVFs and HCTZ d/c'd.    Since discharge pt notes fluctuations in bp, typically low at the end of the day.  BP 98/72, 90/40.  Feels tired when BP is low.  Pt drinking 5-8 bottles of water per day and 2-3 bottles of gatorade.  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  Pt also notes nausea with prolonged standing.  Pt has CPAP at home.  Needs new mask.  Last sleep study 7-8 years ago.  Past Medical History:  Diagnosis Date  . Arthritis   . GERD (gastroesophageal reflux disease)   . Hyperlipidemia   . Hypertension     Allergies  Allergen Reactions  . Augmentin [Amoxicillin-Pot Clavulanate] Nausea And Vomiting  . Lisinopril Other (See Comments)    unknown  . Losartan Other (See Comments)    unknown    ROS General: Denies fever, chills, night sweats, changes in weight, changes in appetite  +near syncope, bp changes, fatigue HEENT: Denies headaches, ear pain, changes in vision, rhinorrhea, sore throat CV: Denies CP, palpitations, SOB, orthopnea Pulm: Denies SOB, cough, wheezing GI: Denies abdominal pain, nausea, vomiting, diarrhea, constipation GU: Denies dysuria, hematuria, frequency, vaginal discharge Msk: Denies muscle cramps, joint pains  +shoulder ache Neuro: Denies weakness, numbness, tingling Skin: Denies rashes, bruising Psych: Denies depression, anxiety, hallucinations    Objective:    Temperature (!) 97.4 F (36.3 C), temperature source Temporal, weight 285 lb (129.3 kg), SpO2 98 %.  Orthostatic vs taken and normal.  done and negative.  Gen. Pleasant, well-nourished, in no distress, normal affect  HEENT: [place]/AT,  face symmetric, conjunctiva clear, no scleral icterus, PERRLA, EOMI, nares patent without drainage Neck: No JVD, no thyromegaly, no carotid bruits Lungs: no accessory muscle use, CTAB, no wheezes or rales Cardiovascular: RRR, no m/r/g, no peripheral edema Musculoskeletal: No deformities, no cyanosis or clubbing, normal tone Neuro:  A&Ox3, CN II-XII intact, normal gait Skin:  Warm, no lesions/ rash  Wt Readings from Last 3 Encounters:  08/05/19 285 lb 4.8 oz (129.4 kg)  02/12/19 297 lb 9.6 oz (135 kg)    Lab Results  Component Value Date   WBC 9.4 08/05/2019   HGB 15.8 08/05/2019   HCT 46.3 08/05/2019   PLT 238 08/05/2019   GLUCOSE 156 (H) 08/05/2019   CHOL 290 (H) 02/12/2019   TRIG 119.0 02/12/2019   HDL 52.50 02/12/2019   LDLCALC 213 (H) 02/12/2019   ALT 35 08/05/2019   AST 29 08/05/2019   NA 139 08/05/2019   K 3.6 08/05/2019   CL 104 08/05/2019   CREATININE 1.24 08/05/2019   BUN 29 (H) 08/05/2019   CO2 23 08/05/2019   HGBA1C 6.1 (H) 08/05/2019    Assessment/Plan:  Hospital discharge follow-up -TCM phone call made and reviewed. -hospital notes and labs reviewed.  OSA on CPAP  -needs new CPAP mask -last sleep study >7 yrs ago.  Will order new study. - Plan: [facility] Sleep Study  Prediabetes -hgb A1C 6.1% -metformin caused GI s/e in the past -discussed lifestyle modifications  Labile essential hypertension -Orthostatic VS done and nml -discussed checking bp at home. -  hydration encouraged -continue olmesartan 40 mg daily.  Will make adjustments if needed.  - Plan: Ambulatory referral to Cardiology, Comprehensive metabolic panel, CBC (no diff), TSH, T4, Free  AKI -will continue to d/c HCTZ -pt to stay hydrated -creat 1.24 -Plan: CMP  Labs ordered, however lab unavailable in clinic this visit.  Pt to schedule a lab appt later this wk/warly next wk.  F/u in the next month, sooner if needed.  Grier Mitts, MD

## 2019-08-12 NOTE — Patient Instructions (Signed)
Prediabetes Prediabetes is the condition of having a blood sugar (blood glucose) level that is higher than it should be, but not high enough for you to be diagnosed with type 2 diabetes. Having prediabetes puts you at risk for developing type 2 diabetes (type 2 diabetes mellitus). Prediabetes may be called impaired glucose tolerance or impaired fasting glucose. Prediabetes usually does not cause symptoms. Your health care provider can diagnose this condition with blood tests. You may be tested for prediabetes if you are overweight and if you have at least one other risk factor for prediabetes. What is blood glucose, and how is it measured? Blood glucose refers to the amount of glucose in your bloodstream. Glucose comes from eating foods that contain sugars and starches (carbohydrates), which the body breaks down into glucose. Your blood glucose level may be measured in mg/dL (milligrams per deciliter) or mmol/L (millimoles per liter). Your blood glucose may be checked with one or more of the following blood tests:  A fasting blood glucose (FBG) test. You will not be allowed to eat (you will fast) for 8 hours or longer before a blood sample is taken. ? A normal range for FBG is 70-100 mg/dl (3.9-5.6 mmol/L).  An A1c (hemoglobin A1c) blood test. This test provides information about blood glucose control over the previous 2?3months.  An oral glucose tolerance test (OGTT). This test measures your blood glucose at two times: ? After fasting. This is your baseline level. ? Two hours after you drink a beverage that contains glucose. You may be diagnosed with prediabetes:  If your FBG is 100?125 mg/dL (5.6-6.9 mmol/L).  If your A1c level is 5.7?6.4%.  If your OGTT result is 140?199 mg/dL (7.8-11 mmol/L). These blood tests may be repeated to confirm your diagnosis. How can this condition affect me? The pancreas produces a hormone (insulin) that helps to move glucose from the bloodstream into cells.  When cells in the body do not respond properly to insulin that the body makes (insulin resistance), excess glucose builds up in the blood instead of going into cells. As a result, high blood glucose (hyperglycemia) can develop, which can cause many complications. Hyperglycemia is a symptom of prediabetes. Having high blood glucose for a long time is dangerous. Too much glucose in your blood can damage your nerves and blood vessels. Long-term damage can lead to complications from diabetes, which may include:  Heart disease.  Stroke.  Blindness.  Kidney disease.  Depression.  Poor circulation in the feet and legs, which could lead to surgical removal (amputation) in severe cases. What can increase my risk? Risk factors for prediabetes include:  Having a family member with type 2 diabetes.  Being overweight or obese.  Being older than age 45.  Being of American Indian, African-American, Hispanic/Latino, or Asian/Pacific Islander descent.  Having an inactive (sedentary) lifestyle.  Having a history of heart disease.  History of gestational diabetes or polycystic ovary syndrome (PCOS), in women.  Having low levels of good cholesterol (HDL-C) or high levels of blood fats (triglycerides).  Having high blood pressure. What actions can I take to prevent diabetes?      Be physically active. ? Do moderate-intensity physical activity for 30 or more minutes on 5 or more days of the week, or as much as told by your health care provider. This could be brisk walking, biking, or water aerobics. ? Ask your health care provider what activities are safe for you. A mix of physical activities may be best, such as   walking, swimming, cycling, and strength training.  Lose weight as told by your health care provider. ? Losing 5-7% of your body weight can reverse insulin resistance. ? Your health care provider can determine how much weight loss is best for you and can help you lose weight  safely.  Follow a healthy meal plan. This includes eating lean proteins, complex carbohydrates, fresh fruits and vegetables, low-fat dairy products, and healthy fats. ? Follow instructions from your health care provider about eating or drinking restrictions. ? Make an appointment to see a diet and nutrition specialist (registered dietitian) to help you create a healthy eating plan that is right for you.  Do not smoke or use any tobacco products, such as cigarettes, chewing tobacco, and e-cigarettes. If you need help quitting, ask your health care provider.  Take over-the-counter and prescription medicines as told by your health care provider. You may be prescribed medicines that help lower the risk of type 2 diabetes.  Keep all follow-up visits as told by your health care provider. This is important. Summary  Prediabetes is the condition of having a blood sugar (blood glucose) level that is higher than it should be, but not high enough for you to be diagnosed with type 2 diabetes.  Having prediabetes puts you at risk for developing type 2 diabetes (type 2 diabetes mellitus).  To help prevent type 2 diabetes, make lifestyle changes such as being physically active and eating a healthy diet. Lose weight as told by your health care provider. This information is not intended to replace advice given to you by your health care provider. Make sure you discuss any questions you have with your health care provider. Document Revised: 06/13/2018 Document Reviewed: 04/12/2015 Elsevier Patient Education  2020 [employer]  Dehydration, Adult Dehydration is a condition in which there is not enough water or other fluids in the body. This happens when a person loses more fluids than he or she takes in. Important organs, such as the kidneys, brain, and heart, cannot function without a proper amount of fluids. Any loss of fluids from the body can lead to dehydration. Dehydration can be mild, moderate, or  severe. It should be treated right away to prevent it from becoming severe. What are the causes? Dehydration may be caused by:  Conditions that cause loss of water or other fluids, such as diarrhea, vomiting, or sweating or urinating a lot.  Not drinking enough fluids, especially when you are ill or doing activities that require a lot of energy.  Other illnesses and conditions, such as fever or infection.  Certain medicines, such as medicines that remove excess fluid from the body (diuretics).  Lack of safe drinking water.  Not being able to get enough water and food. What increases the risk? The following factors may make you more likely to develop this condition:  Having a long-term (chronic) illness that has not been treated properly, such as diabetes, heart disease, or kidney disease.  Being 72 years of age or older.  Having a disability.  Living in a place that is high in altitude, where thinner, drier air causes more fluid loss.  Doing exercises that put stress on your body for a long time (endurance sports). What are the signs or symptoms? Symptoms of dehydration depend on how severe it is. Mild or moderate dehydration  Thirst.  Dry lips or dry mouth.  Dizziness or light-headedness, especially when standing up from a seated position.  Muscle cramps.  Dark urine. Urine may be  the color of tea.  Less urine or tears produced than usual.  Headache. Severe dehydration  Changes in skin. Your skin may be cold and clammy, blotchy, or pale. Your skin also may not return to normal after being lightly pinched and released.  Little or no tears, urine, or sweat.  Changes in vital signs, such as rapid breathing and low blood pressure. Your pulse may be weak or may be faster than 100 beats a minute when you are sitting still.  Other changes, such as: ? Feeling very thirsty. ? Sunken eyes. ? Cold hands and feet. ? Confusion. ? Being very tired (lethargic) or having  trouble waking from sleep. ? Short-term weight loss. ? Loss of consciousness. How is this diagnosed? This condition is diagnosed based on your symptoms and a physical exam. You may have blood and urine tests to help confirm the diagnosis. How is this treated? Treatment for this condition depends on how severe it is. Treatment should be started right away. Do not wait until dehydration becomes severe. Severe dehydration is an emergency and needs to be treated in a hospital.  Mild or moderate dehydration can be treated at home. You may be asked to: ? Drink more fluids. ? Drink an oral rehydration solution (ORS). This drink helps restore proper amounts of fluids and salts and minerals in the blood (electrolytes).  Severe dehydration can be treated: ? With IV fluids. ? By correcting abnormal levels of electrolytes. This is often done by giving electrolytes through a tube that is passed through your nose and into your stomach (nasogastric tube, or NG tube). ? By treating the underlying cause of dehydration. Follow these instructions at home: Oral rehydration solution If told by your health care provider, drink an ORS:  Make an ORS by following instructions on the package.  Start by drinking small amounts, about  cup (120 mL) every 5-10 minutes.  Slowly increase how much you drink until you have taken the amount recommended by your health care provider. Eating and drinking         Drink enough clear fluid to keep your urine pale yellow. If you were told to drink an ORS, finish the ORS first and then start slowly drinking other clear fluids. Drink fluids such as: ? Water. Do not drink only water. Doing that can lead to hyponatremia, which is having too little salt (sodium) in the body. ? Water from ice chips you suck on. ? Fruit juice that you have added water to (diluted fruit juice). ? Low-calorie sports drinks.  Eat foods that contain a healthy balance of electrolytes, such as  bananas, oranges, potatoes, tomatoes, and spinach.  Do not drink alcohol.  Avoid the following: ? Drinks that contain a lot of sugar. These include high-calorie sports drinks, fruit juice that is not diluted, and soda. ? Caffeine. ? Foods that are greasy or contain a lot of fat or sugar. General instructions  Take over-the-counter and prescription medicines only as told by your health care provider.  Do not take sodium tablets. Doing that can lead to having too much sodium in the body (hypernatremia).  Return to your normal activities as told by your health care provider. Ask your health care provider what activities are safe for you.  Keep all follow-up visits as told by your health care provider. This is important. Contact a health care provider if:  You have muscle cramps, pain, or discomfort, such as: ? Pain in your abdomen and the pain gets worse  or stays in one area (localizes). ? Stiff neck.  You have a rash.  You are more irritable than usual.  You are sleepier or have a harder time waking than usual.  You feel weak or dizzy.  You feel very thirsty. Get help right away if you have:  Any symptoms of severe dehydration.  Symptoms of vomiting, such as: ? You cannot eat or drink without vomiting. ? Vomiting gets worse or does not go away. ? Vomit includes blood or green matter (bile).  Symptoms that get worse with treatment.  A fever.  A severe headache.  Problems with urination or bowel movements, such as: ? Diarrhea that gets worse or does not go away. ? Blood in your stool (feces). This may cause stool to look black and tarry. ? Not urinating, or urinating only a small amount of very dark urine, within 6-8 hours.  Trouble breathing. These symptoms may represent a serious problem that is an emergency. Do not wait to see if the symptoms will go away. Get medical help right away. Call your local emergency services (911 in the [place] Do not drive yourself to  the hospital. Summary  Dehydration is a condition in which there is not enough water or other fluids in the body. This happens when a person loses more fluids than he or she takes in.  Treatment for this condition depends on how severe it is. Treatment should be started right away. Do not wait until dehydration becomes severe.  Drink enough clear fluid to keep your urine pale yellow. If you were told to drink an oral rehydration solution (ORS), finish the ORS first and then start slowly drinking other clear fluids.  Take over-the-counter and prescription medicines only as told by your health care provider.  Get help right away if you have any symptoms of severe dehydration. This information is not intended to replace advice given to you by your health care provider. Make sure you discuss any questions you have with your health care provider. Document Revised: 10/02/2018 Document Reviewed: 10/02/2018 Elsevier Patient Education  2020 [employer]  Hypotension As your heart beats, it forces blood through your body. Hypotension, commonly called low blood pressure, is when the force of blood pumping through your arteries is too weak. Arteries are blood vessels that carry blood from the heart throughout the body. Depending on the cause and severity, hypotension may be harmless (benign) or may cause serious problems (be critical). When blood pressure is too low, you may not get enough blood to your brain or to the rest of your organs. This can cause weakness, light-headedness, rapid heartbeat, and fainting. What are the causes? This condition may be caused by:  Blood loss.  Loss of body fluids (dehydration).  Heart problems.  Hormone (endocrine) problems.  Pregnancy.  Severe infection.  Lack of certain nutrients.  Severe allergic reactions (anaphylaxis).  Certain medicines, such as blood pressure medicine or medicines that make the body lose excess fluids (diuretics). Sometimes,  hypotension may be caused by not taking medicine as directed, such as taking too much of a certain medicine. What increases the risk? The following factors may make you more likely to develop this condition:  Age. Risk increases as you get older.  Conditions that affect the heart or the central nervous system.  Taking certain medicines, such as blood pressure medicine or diuretics.  Being pregnant. What are the signs or symptoms? Common symptoms of this condition include:  Weakness.  Light-headedness.  Dizziness.  Blurred vision.  Fatigue.  Rapid heartbeat.  Fainting, in severe cases. How is this diagnosed? This condition is diagnosed based on:  Your medical history.  Your symptoms.  Your blood pressure measurement. Your health care provider will check your blood pressure when you are: ? Lying down. ? Sitting. ? Standing. A blood pressure reading is recorded as two numbers, such as "120 over 80" (or 120/80). The first ("top") number is called the systolic pressure. It is a measure of the pressure in your arteries as your heart beats. The second ("bottom") number is called the diastolic pressure. It is a measure of the pressure in your arteries when your heart relaxes between beats. Blood pressure is measured in a unit called mm Hg. Healthy blood pressure for most adults is 120/80. If your blood pressure is below 90/60, you may be diagnosed with hypotension. Other information or tests that may be used to diagnose hypotension include:  Your other vital signs, such as your heart rate and temperature.  Blood tests.  Tilt table test. For this test, you will be safely secured to a table that moves you from a lying position to an upright position. Your heart rhythm and blood pressure will be monitored during the test. How is this treated? Treatment for this condition may include:  Changing your diet. This may involve eating more salt (sodium) or drinking more  water.  Taking medicines to raise your blood pressure.  Changing the dosage of certain medicines you are taking that might be lowering your blood pressure.  Wearing compression stockings. These stockings help to prevent blood clots and reduce swelling in your legs. In some cases, you may need to go to the hospital for:  Fluid replacement. This means you will receive fluids through an IV.  Blood replacement. This means you will receive donated blood through an IV (transfusion).  Treating an infection or heart problems, if this applies.  Monitoring. You may need to be monitored while medicines that you are taking wear off. Follow these instructions at home: Eating and drinking   Drink enough fluid to keep your urine pale yellow.  Eat a healthy diet, and follow instructions from your health care provider about eating or drinking restrictions. A healthy diet includes: ? Fresh fruits and vegetables. ? Whole grains. ? Lean meats. ? Low-fat dairy products.  Eat extra salt only as directed. Do not add extra salt to your diet unless your health care provider told you to do that.  Eat frequent, small meals.  Avoid standing up suddenly after eating. Medicines  Take over-the-counter and prescription medicines only as told by your health care provider. ? Follow instructions from your health care provider about changing the dosage of your current medicines, if this applies. ? Do not stop or adjust any of your medicines on your own. General instructions   Wear compression stockings as told by your health care provider.  Get up slowly from lying down or sitting positions. This gives your blood pressure a chance to adjust.  Avoid hot showers and excessive heat as directed by your health care provider.  Return to your normal activities as told by your health care provider. Ask your health care provider what activities are safe for you.  Do not use any products that contain nicotine or  tobacco, such as cigarettes, e-cigarettes, and chewing tobacco. If you need help quitting, ask your health care provider.  Keep all follow-up visits as told by your health care provider. This is important.  Contact a health care provider if you:  Vomit.  Have diarrhea.  Have a fever for more than 2-3 days.  Feel more thirsty than usual.  Feel weak and tired. Get help right away if you:  Have chest pain.  Have a fast or irregular heartbeat.  Develop numbness in any part of your body.  Cannot move your arms or your legs.  Have trouble speaking.  Become sweaty or feel light-headed.  Faint.  Feel short of breath.  Have trouble staying awake.  Feel confused. Summary  Hypotension is when the force of blood pumping through your arteries is too weak.  Hypotension may be harmless (benign) or may cause serious problems (be critical).  Treatment for this condition may include changing your diet, changing your medicines, and wearing compression stockings.  In some cases, you may need to go to the hospital for fluid or blood replacement. This information is not intended to replace advice given to you by your health care provider. Make sure you discuss any questions you have with your health care provider. Document Revised: 08/15/2017 Document Reviewed: 08/15/2017 [employer] Patient Education  [employer].

## 2019-08-25 ENCOUNTER — Other Ambulatory Visit: Payer: 59

## 2019-08-26 ENCOUNTER — Other Ambulatory Visit (INDEPENDENT_AMBULATORY_CARE_PROVIDER_SITE_OTHER): Payer: 59

## 2019-08-26 ENCOUNTER — Other Ambulatory Visit: Payer: Self-pay

## 2019-08-26 DIAGNOSIS — I10 Essential (primary) hypertension: Secondary | ICD-10-CM | POA: Diagnosis not present

## 2019-08-26 DIAGNOSIS — N179 Acute kidney failure, unspecified: Secondary | ICD-10-CM | POA: Diagnosis not present

## 2019-08-26 LAB — CBC
HCT: 45.8 % (ref 39.0–52.0)
Hemoglobin: 15.9 g/dL (ref 13.0–17.0)
MCHC: 34.7 g/dL (ref 30.0–36.0)
MCV: 88.8 fl (ref 78.0–100.0)
Platelets: 231 10*3/uL (ref 150.0–400.0)
RBC: 5.16 Mil/uL (ref 4.22–5.81)
RDW: 13.2 % (ref 11.5–15.5)
WBC: 4.4 10*3/uL (ref 4.0–10.5)

## 2019-08-26 LAB — COMPREHENSIVE METABOLIC PANEL
ALT: 45 U/L (ref 0–53)
AST: 37 U/L (ref 0–37)
Albumin: 4.9 g/dL (ref 3.5–5.2)
Alkaline Phosphatase: 81 U/L (ref 39–117)
BUN: 22 mg/dL (ref 6–23)
CO2: 27 mEq/L (ref 19–32)
Calcium: 10.2 mg/dL (ref 8.4–10.5)
Chloride: 101 mEq/L (ref 96–112)
Creatinine, Ser: 1.15 mg/dL (ref 0.40–1.50)
GFR: 67.72 mL/min (ref >60.00)
Glucose, Bld: 95 mg/dL (ref 70–99)
Potassium: 4.1 mEq/L (ref 3.5–5.1)
Sodium: 137 mEq/L (ref 135–145)
Total Bilirubin: 1 mg/dL (ref 0.2–1.2)
Total Protein: 7.9 g/dL (ref 6.0–8.3)

## 2019-08-26 LAB — T4, FREE: Free T4: 0.71 ng/dL (ref 0.60–1.60)

## 2019-08-26 LAB — TSH: TSH: 3.01 u[IU]/mL (ref 0.35–4.50)

## 2019-09-03 DIAGNOSIS — Z7189 Other specified counseling: Secondary | ICD-10-CM | POA: Insufficient documentation

## 2019-09-03 NOTE — Progress Notes (Signed)
Cardiology Office Note   Date:  09/04/2019   ID:  Luverne Zerkle, DOB 1970/12/06, MRN [id number]  PCP:  Deeann Saint, MD  Cardiologist:   No primary care provider on file. Referring:  Deeann Saint, MD   Chief Complaint  Patient presents with  . Dizziness      History of Present Illness: Anthony Bishop is a 49 y.o. 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  The patient has a somewhat complicated recent history.  He was in the emergency room and admitted to the hospital earlier this month.  I reviewed these records for this visit.  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  He developed some cramping across his shoulders.  He felt diaphoretic nauseated.  He had near syncope.  His ears were buzzing.  He went and sat in his car and it took quite a while for him to start to recover.  He went home and had some left-sided chest discomfort and actually an episode of throwing up.  In the hospital he was noted to have elevated creatinine.  He was treated with hydration.  Of note he was hypotensive prior to presentation with blood pressure 70s over 40s.  His cardiac enzymes were in the low 20s and stable.  There were no acute EKG changes.  Of note that discharge today sent him home without hydrochlorothiazide.  He does recount that he has had problems taking lisinopril in the distant past and then Cozaar more recently.  Has been on Benicar.  He thinks some of his symptoms happen after he takes his medicine and is been trying to change the timing of this.  He has lost about 10 pounds of weight without trying.  I do note that his thyroid was normal in the hospital.  He has been out digging holes since then but has not had any new symptoms routinely although he has had one other episode of again feeling the discomfort going across his shoulders with dizziness and some lower blood pressure again with activity.   Once again however this is not reproducible as he can vigorously do that same work without bringing on any symptoms.  He is not describing substernal chest pressure.  Is not having any new PND or orthopnea.  He had a stress test many years ago but has not had any further work-up.  He has significant cardiovascular risk factors with dyslipidemia family history.   Past Medical History:  Diagnosis Date  . Arthritis   . GERD (gastroesophageal reflux disease)   . Hyperlipidemia   . Hypertension     No past surgical history on file.   Current Outpatient Medications  Medication Sig Dispense Refill  . olmesartan (BENICAR) 40 MG tablet Take 1 tablet (40 mg total) by mouth daily. 30 tablet 0  . omeprazole (PRILOSEC) 20 MG capsule Take 1 capsule (20 mg total) by mouth daily as needed (for acid reflux).    Marland Kitchen amLODipine (NORVASC) 2.5 MG tablet Take 1 tablet (2.5 mg total) by mouth daily. 180 tablet 3  . rosuvastatin (CRESTOR) 20 MG tablet Take 1 tablet (20 mg total) by mouth daily. 90 tablet 3   No current facility-administered medications for this visit.    Allergies:   Augmentin [amoxicillin-pot clavulanate], Lisinopril, and Losartan    Social History:  The patient  reports that he has quit smoking. He has never used smokeless tobacco. He  reports current alcohol use. He reports that he does not use drugs.   Family History:  The patient's family history includes Alcohol abuse in his father and sister; Alzheimer's disease in his maternal grandmother; Aortic aneurysm in his mother; Asthma in his sister; Depression in his father; Diabetes in his mother; Drug abuse in his father and sister; Heart disease in his father and maternal grandmother; Hyperlipidemia in his mother; Hypertension in his father, mother, and sister; Kidney disease in his maternal grandfather and maternal grandmother; Miscarriages / [place] in his mother; Stroke in his maternal grandfather.    ROS:  Please see the history of  present illness.   Otherwise, review of systems are positive for none.   All other systems are reviewed and negative.    PHYSICAL EXAM: VS:  BP (!) 154/101   Pulse 80   Ht 6\' 2"  (1.88 m)   Wt 282 lb (127.9 kg)   SpO2 98%   BMI 36.21 kg/m  , BMI Body mass index is 36.21 kg/m. GENERAL:  Well appearing HEENT:  Pupils equal round and reactive, fundi not visualized, oral mucosa unremarkable NECK:  No jugular venous distention, waveform within normal limits, carotid upstroke brisk and symmetric, no bruits, no thyromegaly LYMPHATICS:  No cervical, inguinal adenopathy LUNGS:  Clear to auscultation bilaterally BACK:  No CVA tenderness CHEST:  Unremarkable HEART:  PMI not displaced or sustained,S1 and S2 within normal limits, no S3, no S4, no clicks, no rubs, no murmurs ABD:  Flat, positive bowel sounds normal in frequency in pitch, no bruits, no rebound, no guarding, no midline pulsatile mass, no hepatomegaly, no splenomegaly EXT:  2 plus pulses throughout, no edema, no cyanosis no clubbing SKIN:  No rashes no nodules NEURO:  Cranial nerves II through XII grossly intact, motor grossly intact throughout PSYCH:  Cognitively intact, oriented to person place and time    EKG:  EKG is not ordered today. The ekg ordered 08/04/2019 demonstrates sinus rhythm, rate 111, axis within normal limits, intervals within normal limits, nonspecific T wave flattening.   Recent Labs: 08/26/2019: ALT 45; BUN 22; Creatinine, Ser 1.15; Hemoglobin 15.9; Platelets 231.0; Potassium 4.1; Sodium 137; TSH 3.01    Lipid Panel    Component Value Date/Time   CHOL 290 (H) 02/12/2019 1102   TRIG 119.0 02/12/2019 1102   HDL 52.50 02/12/2019 1102   CHOLHDL 6 02/12/2019 1102   VLDL 23.8 02/12/2019 1102   LDLCALC 213 (H) 02/12/2019 1102      Wt Readings from Last 3 Encounters:  09/04/19 282 lb (127.9 kg)  08/12/19 285 lb (129.3 kg)  08/05/19 285 lb 4.8 oz (129.4 kg)      Other studies Reviewed: Additional  studies/ records that were reviewed today include: Hospital records. Review of the above records demonstrates:  Please see elsewhere in the note.     ASSESSMENT AND PLAN:  SHOULDER PAIN: Patient has some vague symptoms.  I did review the records and he clearly looked like he had some dehydration.  However, there was also some shoulder discomfort and he has significant cardiovascular risk factors.  We first need to exclude obstructive coronary disease.  I am going to send him for an exercise Myoview.  The pretest probability of obstructive coronary disease is at least moderate.  He understands that I would still evaluate him if he continues to have the symptoms even in the face of a normal stress test if that is the result.  He needs aggressive risk reduction.  HTN:  I wonder if he is not tolerating the ARB ACE inhibitor class and I am going to switch him to Norvasc 2.5 mg daily.  DYSLIPDEMIA: He has significant dyslipidemia and has not been on statin.  I am going to start him on Crestor 20 mg daily though I doubt this will get [place] to goals which should be at least a 50% reduction in LDL.  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  DIAPHORESIS: He has sweating but no abnormal TSH.  I will defer further work-up to his primary care physician.    Current medicines are reviewed at length with the patient today.  The patient does not have concerns regarding medicines.  The following changes have been made: As above  Labs/ tests ordered today include:   Orders Placed This Encounter  Procedures  . Lipid panel  . Hepatic function panel  . MYOCARDIAL PERFUSION IMAGING     Disposition:   FU with me after the above testing   Signed, Rollene Rotunda, MD  09/04/2019 5:07 PM    [facility]

## 2019-09-04 ENCOUNTER — Encounter: Payer: Self-pay | Admitting: Cardiology

## 2019-09-04 ENCOUNTER — Other Ambulatory Visit: Payer: Self-pay

## 2019-09-04 ENCOUNTER — Ambulatory Visit: Payer: 59 | Admitting: Cardiology

## 2019-09-04 VITALS — BP 154/101 | HR 80 | Ht 74.0 in | Wt 282.0 lb

## 2019-09-04 DIAGNOSIS — R079 Chest pain, unspecified: Secondary | ICD-10-CM

## 2019-09-04 DIAGNOSIS — Z7189 Other specified counseling: Secondary | ICD-10-CM

## 2019-09-04 DIAGNOSIS — I10 Essential (primary) hypertension: Secondary | ICD-10-CM | POA: Diagnosis not present

## 2019-09-04 DIAGNOSIS — Z5181 Encounter for therapeutic drug level monitoring: Secondary | ICD-10-CM | POA: Diagnosis not present

## 2019-09-04 MED ORDER — ROSUVASTATIN CALCIUM 20 MG PO TABS
20.0000 mg | ORAL_TABLET | Freq: Every day | ORAL | 3 refills | Status: DC
Start: 2019-09-04 — End: 2020-02-08

## 2019-09-04 MED ORDER — AMLODIPINE BESYLATE 2.5 MG PO TABS
2.5000 mg | ORAL_TABLET | Freq: Every day | ORAL | 3 refills | Status: DC
Start: 2019-09-04 — End: 2019-09-18

## 2019-09-04 NOTE — Patient Instructions (Signed)
Medication Instructions:  START AMLODIPINE 2.5 MG DAILY  START ROSUVASTATIN 20 MG DAILY   *If you need a refill on your cardiac medications before your next appointment, please call your pharmacy*   Lab Work: FASTING LP/HFP IN 6-8 WEEKS   If you have labs (blood work) drawn today and your tests are completely normal, you will receive your results only by: Marland Kitchen MyChart Message (if you have MyChart) OR . A paper copy in the mail If you have any lab test that is abnormal or we need to change your treatment, we will call you to review the results.   Testing/Procedures: Your physician has requested that you have en exercise stress myoview. For further information please visit [email or web address] Please follow instruction sheet, as given.  Follow-Up: At [facility], you and your health needs are our priority.  As part of our continuing mission to provide you with exceptional heart care, we have created designated Provider Care Teams.  These Care Teams include your primary Cardiologist (physician) and Advanced Practice Providers (APPs -  Physician Assistants and Nurse Practitioners) who all work together to provide you with the care you need, when you need it.  We recommend signing up for the patient portal called "MyChart".  Sign up information is provided on this After Visit Summary.  MyChart is used to connect with patients for Virtual Visits (Telemedicine).  Patients are able to view lab/test results, encounter notes, upcoming appointments, etc.  Non-urgent messages can be sent to your provider as well.   To learn more about what you can do with MyChart, go to [email or web address].    Your next appointment:   AFTER STRESS TEST  The format for your next appointment:   In Person  Provider:   You may see DR [facility]  or one of the following Advanced Practice Providers on your designated Care Team:    Theodore Demark, PA-C  Joni Reining, DNP, ANP  Cadence Fransico Michael, NP

## 2019-09-11 ENCOUNTER — Telehealth (HOSPITAL_COMMUNITY): Payer: Self-pay

## 2019-09-11 NOTE — Telephone Encounter (Signed)
Encounter complete. 

## 2019-09-16 ENCOUNTER — Ambulatory Visit (HOSPITAL_COMMUNITY)
Admission: RE | Admit: 2019-09-16 | Discharge: 2019-09-16 | Disposition: A | Discharge status: Home / Self Care | Payer: 59 | Source: Ambulatory Visit | Attending: Internal Medicine | Admitting: Internal Medicine

## 2019-09-16 DIAGNOSIS — R079 Chest pain, unspecified: Secondary | ICD-10-CM | POA: Insufficient documentation

## 2019-09-16 DIAGNOSIS — I10 Essential (primary) hypertension: Secondary | ICD-10-CM | POA: Diagnosis present

## 2019-09-16 MED ORDER — TECHNETIUM TC 99M TETROFOSMIN IV KIT
27.8000 | PACK | Freq: Once | INTRAVENOUS | Status: AC | PRN
  Administered 2019-09-16: 27.8 via INTRAVENOUS
  Filled 2019-09-16: qty 28

## 2019-09-16 MED ORDER — REGADENOSON 0.4 MG/5ML IV SOLN
0.4000 mg | Freq: Once | INTRAVENOUS | Status: AC
Start: 2019-09-16 — End: 2019-09-16
  Administered 2019-09-16: 0.4 mg via INTRAVENOUS

## 2019-09-17 ENCOUNTER — Other Ambulatory Visit: Payer: Self-pay

## 2019-09-17 ENCOUNTER — Ambulatory Visit (HOSPITAL_COMMUNITY)
Admission: RE | Admit: 2019-09-17 | Discharge: 2019-09-17 | Disposition: A | Discharge status: Home / Self Care | Payer: 59 | Source: Ambulatory Visit | Attending: Cardiovascular Disease | Admitting: Cardiovascular Disease

## 2019-09-17 DIAGNOSIS — M25519 Pain in unspecified shoulder: Secondary | ICD-10-CM | POA: Insufficient documentation

## 2019-09-17 DIAGNOSIS — R61 Generalized hyperhidrosis: Secondary | ICD-10-CM | POA: Insufficient documentation

## 2019-09-17 LAB — MYOCARDIAL PERFUSION IMAGING
LV dias vol: 179 mL (ref 62–150)
LV sys vol: 96 mL
Peak HR: 88 {beats}/min
Rest HR: 67 {beats}/min
SDS: 2
SRS: 2
SSS: 4
TID: 1.02

## 2019-09-17 MED ORDER — TECHNETIUM TC 99M TETROFOSMIN IV KIT
30.1000 | PACK | Freq: Once | INTRAVENOUS | Status: AC | PRN
  Administered 2019-09-17: 30.1 via INTRAVENOUS

## 2019-09-17 NOTE — Progress Notes (Signed)
Cardiology Office Note   Date:  09/18/2019   ID:  Anthony Bishop, DOB 1971/02/11, MRN [id number]  PCP:  Deeann Saint, MD  Cardiologist:   No primary care provider on file. Referring:  Deeann Saint, MD   Chief Complaint  Patient presents with  . Shoulder Pain      History of Present Illness: Anthony Bishop is a 49 y.o. 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  The patient has a somewhat complicated recent history.  He was in the emergency room and admitted to the hospital earlier this year.   He had, among other symptoms, he had shoulder pain.  I sent him for a perfusion study.  He had a negative myoview.   I took him off of ACE inhibitor to see if any symptoms were related to this.  I started Norvasc.     He seems to be doing better.  His blood pressure is averaging around 150/100 but he is not having any of them 70s systolic that he was having.  He is not having any acute shoulder discomfort he was having with activity.  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  He is not having acute episodes of diaphoresis like he was having.  He has had no presyncope or syncope.  He said no new shortness of breath, PND or orthopnea.  Past Medical History:  Diagnosis Date  . Arthritis   . GERD (gastroesophageal reflux disease)   . Hyperlipidemia   . Hypertension     History reviewed. No pertinent surgical history.   Current Outpatient Medications  Medication Sig Dispense Refill  . amLODipine (NORVASC) 5 MG tablet Take 1 tablet (5 mg total) by mouth daily. 90 tablet 3  . olmesartan (BENICAR) 40 MG tablet Take 1 tablet (40 mg total) by mouth daily. 30 tablet 0  . omeprazole (PRILOSEC) 20 MG capsule Take 1 capsule (20 mg total) by mouth daily as needed (for acid reflux).    . rosuvastatin (CRESTOR) 20 MG tablet Take  1 tablet (20 mg total) by mouth daily. 90 tablet 3   No current facility-administered medications for this visit.    Allergies:   Augmentin [amoxicillin-pot clavulanate], Lisinopril, and Losartan    ROS:  Please see the history of present illness.   Otherwise, review of systems are positive for none.   All other systems are reviewed and negative.    PHYSICAL EXAM: VS:  BP (!) 150/91   Pulse 74   Temp (!) 96.8 F (36 C)   Ht 6\' 2"  (1.88 m)   Wt 286 lb (129.7 kg)   SpO2 97%   BMI 36.72 kg/m  , BMI Body mass index is 36.72 kg/m. GENERAL:  Well appearing NECK:  No jugular venous distention, waveform within normal limits, carotid upstroke brisk and symmetric, no bruits, no thyromegaly LUNGS:  Clear to auscultation bilaterally CHEST:  Unremarkable HEART:  PMI not displaced or sustained,S1 and S2 within normal limits, no S3, no S4, no clicks, no rubs, no murmurs ABD:  Flat, positive bowel sounds normal in frequency in pitch, no bruits, no rebound, no guarding, no midline pulsatile mass, no hepatomegaly, no splenomegaly EXT:  2 plus pulses throughout, no edema, no cyanosis no clubbing   EKG:  EKG is not ordered today.    Recent Labs:  08/26/2019: ALT 45; BUN 22; Creatinine, Ser 1.15; Hemoglobin 15.9; Platelets 231.0; Potassium 4.1; Sodium 137; TSH 3.01    Lipid Panel    Component Value Date/Time   CHOL 290 (H) 02/12/2019 1102   TRIG 119.0 02/12/2019 1102   HDL 52.50 02/12/2019 1102   CHOLHDL 6 02/12/2019 1102   VLDL 23.8 02/12/2019 1102   LDLCALC 213 (H) 02/12/2019 1102      Wt Readings from Last 3 Encounters:  09/18/19 286 lb (129.7 kg)  09/16/19 282 lb (127.9 kg)  09/04/19 282 lb (127.9 kg)      Other studies Reviewed: Additional studies/ records that were reviewed today include: Hospital records. Review of the above records demonstrates:  Please see elsewhere in the note.     ASSESSMENT AND PLAN:  SHOULDER PAIN:  He had a negative Lexiscan Myoview.   Given  the absence of any significant abnormalities with this and improvement in his symptoms no further testing is indicated to look for ischemia but he will have aggressive risk reduction.   HTN:   Today I am going to increase his amlodipine to 5 mg daily.  DYSLIPDEMIA:     LDL was 213 and I started Crestor.  He is tolerating this.  I will order a lipid profile liver enzymes in 3 months.  DIAPHORESIS: As previously noted he had normal TSH and electrolytes.  He is not having significant problems with this currently.  No further work-up.     Current medicines are reviewed at length with the patient today.  The patient does not have concerns regarding medicines.  The following changes have been made: As above  Labs/ tests ordered today include: None  Orders Placed This Encounter  Procedures  . Lipid panel  . Hepatic function panel     Disposition:   FU with me in 4 months.    Signed, Rollene Rotunda, MD  09/18/2019 4:48 PM    [facility]

## 2019-09-18 ENCOUNTER — Ambulatory Visit (INDEPENDENT_AMBULATORY_CARE_PROVIDER_SITE_OTHER): Payer: 59 | Admitting: Cardiology

## 2019-09-18 ENCOUNTER — Encounter: Payer: Self-pay | Admitting: Cardiology

## 2019-09-18 VITALS — BP 150/91 | HR 74 | Temp 96.8°F | Ht 74.0 in | Wt 286.0 lb

## 2019-09-18 DIAGNOSIS — R61 Generalized hyperhidrosis: Secondary | ICD-10-CM

## 2019-09-18 DIAGNOSIS — M25519 Pain in unspecified shoulder: Secondary | ICD-10-CM

## 2019-09-18 DIAGNOSIS — I10 Essential (primary) hypertension: Secondary | ICD-10-CM

## 2019-09-18 DIAGNOSIS — E785 Hyperlipidemia, unspecified: Secondary | ICD-10-CM | POA: Diagnosis not present

## 2019-09-18 MED ORDER — AMLODIPINE BESYLATE 5 MG PO TABS
5.0000 mg | ORAL_TABLET | Freq: Every day | ORAL | 3 refills | Status: DC
Start: 2019-09-18 — End: 2020-01-21

## 2019-09-18 NOTE — Patient Instructions (Addendum)
Medication Instructions:  Increase Norvasc to 5 mg daily   *If you need a refill on your cardiac medications before your next appointment, please call your pharmacy*  Lab: LIPID/LIVER in 3 months (no lab appointment needed, come fasting- nothing to eat or drink)    Follow-Up: At [facility], you and your health needs are our priority.  As part of our continuing mission to provide you with exceptional heart care, we have created designated Provider Care Teams.  These Care Teams include your primary Cardiologist (physician) and Advanced Practice Providers (APPs -  Physician Assistants and Nurse Practitioners) who all work together to provide you with the care you need, when you need it.  We recommend signing up for the patient portal called "MyChart".  Sign up information is provided on this After Visit Summary.  MyChart is used to connect with patients for Virtual Visits (Telemedicine).  Patients are able to view lab/test results, encounter notes, upcoming appointments, etc.  Non-urgent messages can be sent to your provider as well.   To learn more about what you can do with MyChart, go to [email or web address].    Your next appointment:   4 month(s)  The format for your next appointment:   In Person  Provider:   Rollene Rotunda, MD

## 2020-01-20 NOTE — Progress Notes (Signed)
Cardiology Office Note   Date:  01/21/2020   ID:  Anthony Bishop, DOB 1970-03-25, MRN [id number]  PCP:  Deeann Saint, MD  Cardiologist:   Rollene Rotunda, MD Referring:  Deeann Saint, MD   Chief Complaint  Patient presents with  . Dizziness      History of Present Illness: Anthony Bishop is a 49 y.o. 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  The patient has a somewhat complicated history.  He was in the emergency room and admitted to the hospital earlier this year.   He had, among other symptoms, he had shoulder pain.  I sent him for a perfusion study.  He had a negative myoview.   I took him off of ACE inhibitor to see if any symptoms were related to this.  I started Norvasc.     He has been doing okay.  He thinks he has some gout in his toe. The patient denies any new symptoms such as chest discomfort, neck or arm discomfort. There has been no new shortness of breath, PND or orthopnea. There have been no reported palpitations, presyncope or syncope.   He did have an episode of dizziness about a week ago.  This was unexplained.  Past Medical History:  Diagnosis Date  . Arthritis   . GERD (gastroesophageal reflux disease)   . Hyperlipidemia   . Hypertension   . Sleep apnea     History reviewed. No pertinent surgical history.   Current Outpatient Medications  Medication Sig Dispense Refill  . omeprazole (PRILOSEC) 20 MG capsule Take 1 capsule (20 mg total) by mouth daily as needed (for acid reflux).    Marland Kitchen amLODipine (NORVASC) 10 MG tablet Take 1 tablet (10 mg total) by mouth daily. 90 tablet 3  . rosuvastatin (CRESTOR) 20 MG tablet Take 1 tablet (20 mg total) by mouth daily. 90 tablet 3   No current facility-administered medications for this visit.    Allergies:   Augmentin [amoxicillin-pot clavulanate], Lisinopril, and Losartan    ROS:  Please see the history of present illness.   Otherwise, review of  systems are positive for none.   All other systems are reviewed and negative.    PHYSICAL EXAM: VS:  BP (!) 145/81   Pulse 68   Temp (!) 97 F (36.1 C)   Ht 6\' 2"  (1.88 m)   Wt 295 lb (133.8 kg)   SpO2 93%   BMI 37.88 kg/m  , BMI Body mass index is 37.88 kg/m. GENERAL:  Well appearing NECK:  No jugular venous distention, waveform within normal limits, carotid upstroke brisk and symmetric, no bruits, no thyromegaly LUNGS:  Clear to auscultation bilaterally CHEST:  Unremarkable HEART:  PMI not displaced or sustained,S1 and S2 within normal limits, no S3, no S4, no clicks, no rubs, no murmurs ABD:  Flat, positive bowel sounds normal in frequency in pitch, no bruits, no rebound, no guarding, no midline pulsatile mass, no hepatomegaly, no splenomegaly EXT:  2 plus pulses throughout, no edema, no cyanosis no clubbing   EKG:  EKG is  ordered today. Sinus rhythm, rate 68, axis within normal limits, intervals within normal limits, no acute ST-T wave changes.   Recent Labs: 08/26/2019: ALT 45; BUN 22; Creatinine, Ser 1.15; Hemoglobin 15.9; Platelets 231.0; Potassium 4.1; Sodium 137; TSH 3.01    Lipid Panel    Component Value Date/Time   CHOL 290 (H) 02/12/2019 1102   TRIG  119.0 02/12/2019 1102   HDL 52.50 02/12/2019 1102   CHOLHDL 6 02/12/2019 1102   VLDL 23.8 02/12/2019 1102   LDLCALC 213 (H) 02/12/2019 1102      Wt Readings from Last 3 Encounters:  01/21/20 295 lb (133.8 kg)  09/18/19 286 lb (129.7 kg)  09/16/19 282 lb (127.9 kg)      Other studies Reviewed: Additional studies/ records that were reviewed today include: Labs Review of the above records demonstrates:  Please see elsewhere in the note.     ASSESSMENT AND PLAN:  SHOULDER PAIN:     He has had no further pain.  No further testing is indicated.  We will continue with aggressive risk reduction.   HTN:    Today I am going to increase his amlodipine to 10 mg daily.  He will keep a blood pressure log and let me  know via MyChart his results.   DYSLIPDEMIA:     LDL was 213 previously.  I started Crestor.  I will check lipid profile liver enzymes.   SLEEP APNEA:   He has sleep apnea.  He moved here from [place].  His mask is 41 or 49 years old.  He is interested in getting up with a sleep physician here and possibly might need refitting.  I will check with our sleep team to see if we need to order a sleep study first or have him scheduled to see Dr. Tresa Endo in the office first.  He says he does have a downloadable card that he could bring in if helpful.   Current medicines are reviewed at length with the patient today.  The patient does not have concerns regarding medicines.  The following changes have been made: As above  Labs/ tests ordered today include: As above  Orders Placed This Encounter  Procedures  . Lipid panel  . Hepatic function panel  . EKG 12-Lead     Disposition:   FU with me in 12 41months.    Signed, Rollene Rotunda, MD  01/21/2020 10:32 AM    [facility]

## 2020-01-21 ENCOUNTER — Ambulatory Visit: Payer: 59 | Admitting: Cardiology

## 2020-01-21 ENCOUNTER — Encounter: Payer: Self-pay | Admitting: Cardiology

## 2020-01-21 ENCOUNTER — Other Ambulatory Visit: Payer: Self-pay

## 2020-01-21 VITALS — BP 145/81 | HR 68 | Temp 97.0°F | Ht 74.0 in | Wt 295.0 lb

## 2020-01-21 DIAGNOSIS — I10 Essential (primary) hypertension: Secondary | ICD-10-CM | POA: Diagnosis not present

## 2020-01-21 DIAGNOSIS — M25519 Pain in unspecified shoulder: Secondary | ICD-10-CM | POA: Diagnosis not present

## 2020-01-21 DIAGNOSIS — E785 Hyperlipidemia, unspecified: Secondary | ICD-10-CM | POA: Diagnosis not present

## 2020-01-21 MED ORDER — AMLODIPINE BESYLATE 10 MG PO TABS: 10.0000 mg | ORAL_TABLET | Freq: Every day | ORAL | 3 refills | Status: DC

## 2020-01-21 NOTE — Patient Instructions (Addendum)
Medication Instructions:  Increase Amlodipine to 10mg  daily *If you need a refill on your cardiac medications before your next appointment, please call your pharmacy*  Lab Work: Your physician recommends that you return for lab work: Lipids / Liver  Testing/Procedures: None ordered during this visit, will be in touch about CPAP machine  Keep a blood pressure diary - can send by mychart  Follow-Up: At [facility], you and your health needs are our priority.  As part of our continuing mission to provide you with exceptional heart care, we have created designated Provider Care Teams.  These Care Teams include your primary Cardiologist (physician) and Advanced Practice Providers (APPs -  Physician Assistants and Nurse Practitioners) who all work together to provide you with the care you need, when you need it.  We recommend signing up for the patient portal called "MyChart".  Sign up information is provided on this After Visit Summary.  MyChart is used to connect with patients for Virtual Visits (Telemedicine).  Patients are able to view lab/test results, encounter notes, upcoming appointments, etc.  Non-urgent messages can be sent to your provider as well.   To learn more about what you can do with MyChart, go to [facility].    Your next appointment:   12 month(s)  You will receive a reminder letter in the mail two months in advance. If you don't receive a letter, please call our office to schedule the follow-up appointment.  The format for your next appointment:   In Person  Provider:   [email or web address], MD

## 2020-02-04 LAB — LIPID PANEL
Chol/HDL Ratio: 3.5 ratio (ref 0.0–5.0)
Cholesterol, Total: 176 mg/dL (ref 100–199)
HDL: 50 mg/dL (ref >39)
LDL Chol Calc (NIH): 108 mg/dL — ABNORMAL HIGH (ref 0–99)
Triglycerides: 97 mg/dL (ref 0–149)
VLDL Cholesterol Cal: 18 mg/dL (ref 5–40)

## 2020-02-04 LAB — HEPATIC FUNCTION PANEL
ALT: 44 IU/L (ref 0–44)
AST: 34 IU/L (ref 0–40)
Albumin: 4.5 g/dL (ref 4.0–5.0)
Alkaline Phosphatase: 116 IU/L (ref 44–121)
Bilirubin Total: 0.5 mg/dL (ref 0.0–1.2)
Bilirubin, Direct: 0.18 mg/dL (ref 0.00–0.40)
Total Protein: 7.3 g/dL (ref 6.0–8.5)

## 2020-02-05 ENCOUNTER — Telehealth: Payer: Self-pay | Admitting: Cardiology

## 2020-02-05 DIAGNOSIS — G4730 Sleep apnea, unspecified: Secondary | ICD-10-CM

## 2020-02-05 NOTE — Telephone Encounter (Signed)
Anthony Bishop states he is returning a call he received this morning from the office. I was unable to find documentation of who called, but he states he believes it was in regards to his lab results. Please advise.

## 2020-02-05 NOTE — Telephone Encounter (Signed)
Patient is following up regarding sleep clinic appointment. He states when speaking with the nurse, the call was disconnected.

## 2020-02-05 NOTE — Telephone Encounter (Signed)
Spoke with patient. No further questions at this time

## 2020-02-05 NOTE — Telephone Encounter (Signed)
Spoke with patient. Informed patient he will need new patient appointment with Dr. Tresa Endo for sleep clinic. Appointment made for 3-14 at 8am. Patient to bring mask and machine to appointment.

## 2020-02-08 ENCOUNTER — Telehealth: Payer: Self-pay

## 2020-02-08 DIAGNOSIS — E785 Hyperlipidemia, unspecified: Secondary | ICD-10-CM

## 2020-02-08 MED ORDER — ROSUVASTATIN CALCIUM 40 MG PO TABS: 40.0000 mg | ORAL_TABLET | Freq: Every day | ORAL | 3 refills | Status: DC

## 2020-02-08 NOTE — Telephone Encounter (Signed)
Discussed lipid results and recommendations. Order placed for Lipid and Liver in 10 weeks. Crestor increased to 40mg  daily. Patient verbalized understands.

## 2020-02-25 ENCOUNTER — Other Ambulatory Visit: Payer: Self-pay | Admitting: Family Medicine

## 2020-02-25 DIAGNOSIS — K219 Gastro-esophageal reflux disease without esophagitis: Secondary | ICD-10-CM

## 2020-05-13 ENCOUNTER — Telehealth: Payer: Self-pay | Admitting: *Deleted

## 2020-05-13 NOTE — Telephone Encounter (Signed)
Left message to bring CPAP machine to Monday's appointment.

## 2020-05-16 ENCOUNTER — Other Ambulatory Visit: Payer: Self-pay

## 2020-05-16 ENCOUNTER — Ambulatory Visit (INDEPENDENT_AMBULATORY_CARE_PROVIDER_SITE_OTHER): Payer: 59 | Admitting: Cardiovascular Disease

## 2020-05-16 ENCOUNTER — Encounter: Payer: Self-pay | Admitting: Cardiovascular Disease

## 2020-05-16 VITALS — BP 144/96 | HR 64 | Ht 74.0 in | Wt 288.6 lb

## 2020-05-16 DIAGNOSIS — G4733 Obstructive sleep apnea (adult) (pediatric): Secondary | ICD-10-CM | POA: Diagnosis not present

## 2020-05-16 DIAGNOSIS — I10 Essential (primary) hypertension: Secondary | ICD-10-CM | POA: Diagnosis not present

## 2020-05-16 DIAGNOSIS — G4730 Sleep apnea, unspecified: Secondary | ICD-10-CM

## 2020-05-16 DIAGNOSIS — E785 Hyperlipidemia, unspecified: Secondary | ICD-10-CM | POA: Diagnosis not present

## 2020-05-16 DIAGNOSIS — K219 Gastro-esophageal reflux disease without esophagitis: Secondary | ICD-10-CM | POA: Diagnosis not present

## 2020-05-16 NOTE — Patient Instructions (Signed)
Medication Instructions:  No changes with medications *If you need a refill on your cardiac medications before your next appointment, please call your pharmacy*   Lab Work:  Not needed   Testing/Procedures: Not needed   Follow-Up: At [facility], you and your health needs are our priority.  As part of our continuing mission to provide you with exceptional heart care, we have created designated Provider Care Teams.  These Care Teams include your primary Cardiologist (physician) and Advanced Practice Providers (APPs -  Physician Assistants and Nurse Practitioners) who all work together to provide you with the care you need, when you need it.     Your next appointment:  as needed for sleep management  The format for your next appointment:   In Person  Provider:   You may see Dr Nicki Guadalajara   Other Instructions No changes with C-PAP settings at present time.

## 2020-05-16 NOTE — Progress Notes (Signed)
Cardiology Office Note    Date:  05/22/2020   ID:  Anthony Bishop, DOB 1971/03/02, MRN [id number]  PCP:  Billie Ruddy, MD  Cardiologist:  Shelva Majestic, MD (sleep); Dr. Percival Spanish  Initial sleep evaluation with me  History of Present Illness:  Anthony Bishop is a 49 y.o. male who recently moved to the [place] area from [place] in June 2021.  He has established cardiology care with Dr. Percival Spanish.  He has a history of hypertension, dyslipidemia, and recently had experienced some shoulder discomfort.  He was originally diagnosed with sleep apnea in [place] in 2015.  He admits to excellent compliance.  He brought his machine in with him today and his CPAP setting is at 12 cm.  He is 100% compliant with 30 out of 30 days use with average use at approximately 7 hours per night with an AHI of 2.4.  He is in need for new equipment.  Prior to initiating CPAP therapy he admitted to snoring, awakening gasping for breath.  He had daytime sleepiness.  He recently saw Dr. Percival Spanish and he was referred to me to establish sleep care and to be set up with a DME company locally.  Presently he believes he is sleeping well.  He is unaware of any breakthrough snoring.  He denies any restless legs, bruxism, hypnagogic hallucinations or cataplectic events.  An Epworth Sleepiness Scale score was calculated today and this endorsed at 12 suggesting some residual daytime sleepiness.  He typically goes to bed around 10 PM and wakes up around 6 AM.  On weekends he often stays up later.   Past Medical History:  Diagnosis Date  . Arthritis   . GERD (gastroesophageal reflux disease)   . Hyperlipidemia   . Hypertension   . Sleep apnea     No past surgical history on file.  Current Medications: Outpatient Medications Prior to Visit  Medication Sig Dispense Refill  . AMLODIPINE BENZOATE PO Take 10 mg by mouth daily.    Marland Kitchen omeprazole (PRILOSEC) 20 MG capsule TAKE 1 CAPSULE BY MOUTH EVERY DAY 90  capsule 3  . rosuvastatin (CRESTOR) 40 MG tablet Take 1 tablet (40 mg total) by mouth daily. 90 tablet 3  . amLODipine (NORVASC) 10 MG tablet Take 1 tablet (10 mg total) by mouth daily. 90 tablet 3   No facility-administered medications prior to visit.     Allergies:   Augmentin [amoxicillin-pot clavulanate], Lisinopril, and Losartan   Social History   Socioeconomic History  . Marital status: Married    Spouse name: Not on file  . Number of children: Not on file  . Years of education: Not on file  . Highest education level: Not on file  Occupational History  . Not on file  Tobacco Use  . Smoking status: Former Research scientist (life sciences)  . Smokeless tobacco: Never Used  Substance and Sexual Activity  . Alcohol use: Yes  . Drug use: Never  . Sexual activity: Yes  Other Topics Concern  . Not on file  Social History Narrative  . Not on file   Social Determinants of Health   Financial Resource Strain: Not on file  Food Insecurity: Not on file  Transportation Needs: Not on file  Physical Activity: Not on file  Stress: Not on file  Social Connections: Not on file    Socially he is married for 15 years.  No children.  He works outside Technical sales engineer.  He lives in  [place].  Family History:  The patient's family history includes Alcohol abuse in his father and sister; Alzheimer's disease in his maternal grandmother; Aortic aneurysm in his mother; Asthma in his sister; Depression in his father; Diabetes in his mother; Drug abuse in his father and sister; Heart disease in his father and maternal grandmother; Hyperlipidemia in his mother; Hypertension in his father, mother, and sister; Kidney disease in his maternal grandfather and maternal grandmother; Miscarriages / [place] in his mother; Stroke in his maternal grandfather.   ROS General: Negative; No fevers, chills, or night sweats;  HEENT: Negative; No changes in vision or hearing, sinus congestion,  difficulty swallowing Pulmonary: Negative; No cough, wheezing, shortness of breath, hemoptysis Cardiovascular: positive for hypertension and hyperlipidemia. GI: Negative; No nausea, vomiting, diarrhea, or abdominal pain GU: Negative; No dysuria, hematuria, or difficulty voiding Musculoskeletal: Negative; no myalgias, joint pain, or weakness Hematologic/Oncology: Negative; no easy bruising, bleeding Endocrine: Negative; no heat/cold intolerance; no diabetes Neuro: Negative; no changes in balance, headaches Skin: Negative; No rashes or skin lesions Psychiatric: Negative; No behavioral problems, depression Sleep: See HPI: Originally diagnosed with OSA around 2015. Other comprehensive 14 point system review is negative.   PHYSICAL EXAM:   VS:  BP (!) 144/96 (BP Location: Left Arm, Patient Position: Sitting)   Pulse 64   Ht '6\' 2"'  (1.88 m)   Wt 288 lb 9.6 oz (130.9 kg)   SpO2 97%   BMI 37.05 kg/m     Repeat blood pressure by me was 128/94  Wt Readings from Last 3 Encounters:  05/16/20 288 lb 9.6 oz (130.9 kg)  01/21/20 295 lb (133.8 kg)  09/18/19 286 lb (129.7 kg)    General: Alert, oriented, no distress.  Skin: normal turgor, no rashes, warm and dry HEENT: Normocephalic, atraumatic. Pupils equal round and reactive to light; sclera anicteric; extraocular muscles intact;  Nose without nasal septal hypertrophy Mouth/Parynx benign; Mallinpatti scale 3 Neck: No JVD, no carotid bruits; normal carotid upstroke Lungs: clear to ausculatation and percussion; no wheezing or rales Chest wall: without tenderness to palpitation Heart: PMI not displaced, RRR, s1 s2 normal, 1/6 systolic murmur, no diastolic murmur, no rubs, gallops, thrills, or heaves Abdomen: soft, nontender; no hepatosplenomehaly, BS+; abdominal aorta nontender and not dilated by palpation. Back: no CVA tenderness Pulses 2+ Musculoskeletal: full range of motion, normal strength, no joint deformities Extremities: no clubbing  cyanosis or edema, Homan's sign negative  Neurologic: grossly nonfocal; Cranial nerves grossly wnl Psychologic: Normal mood and affect   Studies/Labs Reviewed:   EKG:  EKG is ordered today.  ECG (independently read by me):  NSR at 64; LVH; inferior Q II, aVF  Recent Labs: BMP Latest Ref Rng & Units 08/26/2019 08/05/2019 08/04/2019  Glucose 70 - 99 mg/dL 95 156(H) 158(H)  BUN 6 - 23 mg/dL 22 29(H) 26(H)  Creatinine 0.40 - 1.50 mg/dL 1.15 1.24 1.81(H)  Sodium 135 - 145 mEq/L 137 139 136  Potassium 3.5 - 5.1 mEq/L 4.1 3.6 4.1  Chloride 96 - 112 mEq/L 101 104 98  CO2 19 - 32 mEq/L 27 23 20(L)  Calcium 8.4 - 10.5 mg/dL 10.2 9.7 10.3     Hepatic Function Latest Ref Rng & Units 02/04/2020 08/26/2019 08/05/2019  Total Protein 6.0 - 8.5 g/dL 7.3 7.9 7.0  Albumin 4.0 - 5.0 g/dL 4.5 4.9 3.6  AST 0 - 40 IU/L 34 37 29  ALT 0 - 44 IU/L 44 45 35  Alk Phosphatase 44 - 121 IU/L 116 81 66  Total Bilirubin 0.0 - 1.2 mg/dL 0.5 1.0 1.2  Bilirubin, Direct 0.00 - 0.40 mg/dL 0.18 - -    CBC Latest Ref Rng & Units 08/26/2019 08/05/2019 08/04/2019  WBC 4.0 - 10.5 K/uL 4.4 9.4 10.9(H)  Hemoglobin 13.0 - 17.0 g/dL 15.9 15.8 18.1(H)  Hematocrit 39.0 - 52.0 % 45.8 46.3 53.4(H)  Platelets 150.0 - 400.0 K/uL 231.0 238 279   Lab Results  Component Value Date   MCV 88.8 08/26/2019   MCV 90.1 08/05/2019   MCV 89.0 08/04/2019   Lab Results  Component Value Date   TSH 3.01 08/26/2019   Lab Results  Component Value Date   HGBA1C 6.1 (H) 08/05/2019     BNP No results found for: BNP  ProBNP No results found for: PROBNP   Lipid Panel     Component Value Date/Time   CHOL 176 02/04/2020 0832   TRIG 97 02/04/2020 0832   HDL 50 02/04/2020 0832   CHOLHDL 3.5 02/04/2020 0832   CHOLHDL 6 02/12/2019 1102   VLDL 23.8 02/12/2019 1102   LDLCALC 108 (H) 02/04/2020 0832   LABVLDL 18 02/04/2020 0832     RADIOLOGY: No results found.   Additional studies/ records that were reviewed today include:  I  personally interrogated his CPAP unit today.  Records of Dr. Percival Spanish were reviewed.   ASSESSMENT:    1. OSA (obstructive sleep apnea)   2. Essential hypertension   3. Dyslipidemia   4. Gastroesophageal reflux disease without esophagitis     PLAN:  Anthony Bishop is a 50 year old gentleman who moved from [place] in June 2021.  He established cardiology care with Dr. Percival Spanish.  He had a normal myocardial perfusion study in July 2020.  He is on low to pain 10 mg for hypertension and rosuvastatin 40 mg for hyperlipidemia.  He has a history of GERD for which he takes omeprazole.  Diagnosed with sleep apnea in 2015.  He brought his machine with him today.  He is 100% compliant and is averaging approximately 7 hours of use per night.  At his CPAP set pressure of 12 cm, AHI is 2.4 over the last 30 days.  I will set him up with a new DME company.  He will be able to obtain supplies.  We will have his machine linked to our office so that we may assess his data wirelessly.  He notes marked improvement in his sleep quality since CPAP initiation.  He is unaware of any breakthrough snoring.  We discussed optimal sleep duration ideally at 8 hours.  I discussed the benefits of sleep apnea with reference to his cardiovascular health and adverse consequences of untreated sleep apnea.  He will follow up with Dr. Percival Spanish.  I will see him in 1 year for reevaluation or as needed.   Medication Adjustments/Labs and Tests Ordered: Current medicines are reviewed at length with the patient today.  Concerns regarding medicines are outlined above.  Medication changes, Labs and Tests ordered today are listed in the Patient Instructions below. Patient Instructions  Medication Instructions:  No changes with medications *If you need a refill on your cardiac medications before your next appointment, please call your pharmacy*   Lab Work:  Not needed   Testing/Procedures: Not needed   Follow-Up: At [facility],  you and your health needs are our priority.  As part of our continuing mission to provide you with exceptional heart care, we have created designated Provider Care Teams.  These Care Teams include your primary  Cardiologist (physician) and Advanced Practice Providers (APPs -  Physician Assistants and Nurse Practitioners) who all work together to provide you with the care you need, when you need it.     Your next appointment:  as needed for sleep management  The format for your next appointment:   In Person  Provider:   You may see Dr Shelva Majestic   Other Instructions No changes with C-PAP settings at present time.    Signed, Shelva Majestic, MD  05/22/2020 4:23 PM    [facility] [address] [place], [place], [place]  [address] Phone: [phone]

## 2020-09-21 ENCOUNTER — Telehealth: Payer: Self-pay | Admitting: Cardiology

## 2020-09-21 NOTE — Telephone Encounter (Signed)
Patient called in to say that he needs new mask, headband for the mask as well as filters. Please advise

## 2020-09-22 NOTE — Telephone Encounter (Signed)
Cpap supplies ordered with [facility].

## 2020-10-25 LAB — HEPATIC FUNCTION PANEL
ALT: 39 IU/L (ref 0–44)
AST: 34 IU/L (ref 0–40)
Albumin: 4.9 g/dL (ref 4.0–5.0)
Alkaline Phosphatase: 108 IU/L (ref 44–121)
Bilirubin Total: 0.6 mg/dL (ref 0.0–1.2)
Bilirubin, Direct: 0.2 mg/dL (ref 0.00–0.40)
Total Protein: 7.9 g/dL (ref 6.0–8.5)

## 2020-10-25 LAB — LIPID PANEL
Chol/HDL Ratio: 3.9 ratio (ref 0.0–5.0)
Cholesterol, Total: 209 mg/dL — ABNORMAL HIGH (ref 100–199)
HDL: 54 mg/dL (ref >39)
LDL Chol Calc (NIH): 128 mg/dL — ABNORMAL HIGH (ref 0–99)
Triglycerides: 152 mg/dL — ABNORMAL HIGH (ref 0–149)
VLDL Cholesterol Cal: 27 mg/dL (ref 5–40)

## 2020-10-26 ENCOUNTER — Other Ambulatory Visit: Payer: Self-pay | Admitting: *Deleted

## 2020-10-26 ENCOUNTER — Encounter: Payer: Self-pay | Admitting: *Deleted

## 2020-10-26 ENCOUNTER — Telehealth: Payer: Self-pay | Admitting: *Deleted

## 2020-10-26 DIAGNOSIS — E785 Hyperlipidemia, unspecified: Secondary | ICD-10-CM

## 2020-10-26 MED ORDER — AMLODIPINE BESYLATE 10 MG PO TABS: 10.0000 mg | ORAL_TABLET | Freq: Every day | ORAL | 3 refills | Status: DC

## 2020-10-26 NOTE — Telephone Encounter (Addendum)
-----   Message from Rollene Rotunda, MD sent at 10/26/2020 12:00 PM EDT ----- His lipids are much better but still not at target.  I would suggest increasing Crestor to 40 mg daily and check another lipid profile in about 10 weeks.  Call Anthony Bishop with the results and send results to Deeann Saint, MD   Spoke with pt, he reports missing several doses of the rosuvastatin prior to this lab work. He is currently taking 40 mg once daily. He will get on track and restart taking the medications on a daily bases. Lab orders mailed to the pt

## 2020-10-26 NOTE — Telephone Encounter (Signed)
This encounter was created in error - please disregard.

## 2020-10-26 NOTE — Telephone Encounter (Signed)
-----   Message from Rollene Rotunda, MD sent at 10/26/2020 12:00 PM EDT ----- His lipids are much better but still not at target.  I would suggest increasing Crestor to 40 mg daily and check another lipid profile in about 10 weeks.  Call Mr. Thibault with the results and send results to Deeann Saint, MD

## 2021-03-16 NOTE — Progress Notes (Signed)
Cardiology Office Note   Date:  03/17/2021   ID:  Anthony Bishop, DOB 01-15-1971, MRN [id number]  PCP:  Deeann Saint, MD  Cardiologist:   Rollene Rotunda, MD Referring:  Deeann Saint, MD   Chief Complaint  Patient presents with   Shortness of Breath      History of Present Illness: Anthony Bishop is a 51 y.o. 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  He has had shoulder pain.  I sent him for a perfusion study.  He had a negative myoview in 2021.    He returns for follow up.  He has done relatively well since I saw him.  He does get some chest burning.  Sometimes this is associated with food but other times it comes on more sporadically.  He is not having any new shoulder pain.  He has had a couple episodes of feeling very lightheaded and dizzy while sitting on the couch.  These have lasted for a few to several seconds.  He describes them as vertiginous.  He takes dishes for living.  He does get some shortness of breath but he denies any PND or orthopnea.  He said no palpitations, presyncope or syncope.  He has had no weight gain or edema.   Past Medical History:  Diagnosis Date   Arthritis    GERD (gastroesophageal reflux disease)    Hyperlipidemia    Hypertension    Sleep apnea     History reviewed. No pertinent surgical history.   Current Outpatient Medications  Medication Sig Dispense Refill   omeprazole (PRILOSEC) 20 MG capsule TAKE 1 CAPSULE BY MOUTH EVERY DAY 90 capsule 3   rosuvastatin (CRESTOR) 40 MG tablet Take 1 tablet (40 mg total) by mouth daily. 90 tablet 3   amLODipine (NORVASC) 10 MG tablet Take 1 tablet (10 mg total) by mouth daily. 90 tablet 3   No current facility-administered medications for this visit.    Allergies:   Augmentin [amoxicillin-pot clavulanate], Lisinopril, and Losartan    ROS:  Please see the history of present illness.   Otherwise, review of systems are positive for  none.   All other systems are reviewed and negative.    PHYSICAL EXAM: VS:  BP (!) 148/96    Pulse 67    Ht 6\' 2"  (1.88 m)    Wt 300 lb (136.1 kg)    SpO2 96%    BMI 38.52 kg/m  , BMI Body mass index is 38.52 kg/m. GENERAL:  Well appearing NECK:  No jugular venous distention, waveform within normal limits, carotid upstroke brisk and symmetric, no bruits, no thyromegaly LUNGS:  Clear to auscultation bilaterally CHEST:  Unremarkable HEART:  PMI not displaced or sustained,S1 and S2 within normal limits, no S3, no S4, no clicks, no rubs, no murmurs ABD:  Flat, positive bowel sounds normal in frequency in pitch, no bruits, no rebound, no guarding, no midline pulsatile mass, no hepatomegaly, no splenomegaly EXT:  2 plus pulses throughout, no edema, no cyanosis no clubbing   EKG:  EKG is not ordered today. Sinus rhythm, rate 64, axis within normal limits, intervals within normal limits, no acute ST-T wave changes.  May 16, 2020   Recent Labs: 10/24/2020: ALT 39    Lipid Panel    Component Value Date/Time   CHOL 209 (H) 10/24/2020 1235   TRIG 152 (H) 10/24/2020 1235   HDL 54 10/24/2020 1235   CHOLHDL 3.9  10/24/2020 1235   CHOLHDL 6 02/12/2019 1102   VLDL 23.8 02/12/2019 1102   LDLCALC 128 (H) 10/24/2020 1235      Wt Readings from Last 3 Encounters:  03/17/21 300 lb (136.1 kg)  05/16/20 288 lb 9.6 oz (130.9 kg)  01/21/20 295 lb (133.8 kg)      Other studies Reviewed: Additional studies/ records that were reviewed today include: None Review of the above records demonstrates:  Please see elsewhere in the note.     ASSESSMENT AND PLAN:  SOB:    He does have some mild shortness of breath.  He had a negative stress test in 2021.  I would like to get a coronary calcium score to understand goals of therapy.   HTN:    Slightly elevated.  However, I am hoping that with treatment of his sleep apnea he might not need med titration but once he has his new equipment if he still above  140 I will need to titrate his meds. \  DYSLIPDEMIA:     LDL was 128 down from 213.  Goals of therapy will be based on the above.  I will be checking a lipid profile and liver enzymes today.  I will also check an A1c.  He needs other routine labs as he has not had any by his primary and will get a CBC as well.   SLEEP APNEA:  He is being seen by Dr. Tresa Endo.  He says he does not yet have any new equipment and its not working.  We will assist him with this today.   Current medicines are reviewed at length with the patient today.  The patient does not have concerns regarding medicines.  The following changes have been made:  None  Labs/ tests ordered today include:   Orders Placed This Encounter  Procedures   CT CARDIAC SCORING (SELF PAY ONLY)   Lipid panel   Comprehensive Metabolic Panel (CMET)   CBC w/Diff/Platelet   HgB A1c     Disposition:   FU with me in 12 months.    Signed, Rollene Rotunda, MD  03/17/2021 8:16 AM    [facility]

## 2021-03-17 ENCOUNTER — Encounter: Payer: Self-pay | Admitting: Cardiology

## 2021-03-17 ENCOUNTER — Ambulatory Visit (INDEPENDENT_AMBULATORY_CARE_PROVIDER_SITE_OTHER): Payer: BC Managed Care – PPO | Admitting: Cardiology

## 2021-03-17 ENCOUNTER — Encounter: Payer: Self-pay | Admitting: Cardiovascular Disease

## 2021-03-17 ENCOUNTER — Other Ambulatory Visit: Payer: Self-pay

## 2021-03-17 VITALS — BP 148/96 | HR 67 | Ht 74.0 in | Wt 300.0 lb

## 2021-03-17 DIAGNOSIS — R0602 Shortness of breath: Secondary | ICD-10-CM

## 2021-03-17 DIAGNOSIS — E785 Hyperlipidemia, unspecified: Secondary | ICD-10-CM

## 2021-03-17 DIAGNOSIS — I10 Essential (primary) hypertension: Secondary | ICD-10-CM

## 2021-03-17 DIAGNOSIS — M25519 Pain in unspecified shoulder: Secondary | ICD-10-CM | POA: Diagnosis not present

## 2021-03-17 LAB — LIPID PANEL
Chol/HDL Ratio: 4.3 ratio (ref 0.0–5.0)
Cholesterol, Total: 182 mg/dL (ref 100–199)
HDL: 42 mg/dL (ref >39)
LDL Chol Calc (NIH): 107 mg/dL — ABNORMAL HIGH (ref 0–99)
Triglycerides: 190 mg/dL — ABNORMAL HIGH (ref 0–149)
VLDL Cholesterol Cal: 33 mg/dL (ref 5–40)

## 2021-03-17 LAB — CBC WITH DIFFERENTIAL/PLATELET
Basophils Absolute: 0 10*3/uL (ref 0.0–0.2)
Basos: 1 %
EOS (ABSOLUTE): 0.1 10*3/uL (ref 0.0–0.4)
Eos: 3 %
Hematocrit: 45.1 % (ref 37.5–51.0)
Hemoglobin: 15.3 g/dL (ref 13.0–17.7)
Immature Grans (Abs): 0 10*3/uL (ref 0.0–0.1)
Immature Granulocytes: 0 %
Lymphocytes Absolute: 1 10*3/uL (ref 0.7–3.1)
Lymphs: 29 %
MCH: 29.9 pg (ref 26.6–33.0)
MCHC: 33.9 g/dL (ref 31.5–35.7)
MCV: 88 fL (ref 79–97)
Monocytes Absolute: 0.4 10*3/uL (ref 0.1–0.9)
Monocytes: 13 %
Neutrophils Absolute: 1.8 10*3/uL (ref 1.4–7.0)
Neutrophils: 54 %
Platelets: 242 10*3/uL (ref 150–450)
RBC: 5.12 x10E6/uL (ref 4.14–5.80)
RDW: 12.4 % (ref 11.6–15.4)
WBC: 3.4 10*3/uL (ref 3.4–10.8)

## 2021-03-17 LAB — COMPREHENSIVE METABOLIC PANEL
ALT: 51 IU/L — ABNORMAL HIGH (ref 0–44)
AST: 41 IU/L — ABNORMAL HIGH (ref 0–40)
Albumin/Globulin Ratio: 1.6 (ref 1.2–2.2)
Albumin: 4.6 g/dL (ref 4.0–5.0)
Alkaline Phosphatase: 111 IU/L (ref 44–121)
BUN/Creatinine Ratio: 10 (ref 9–20)
BUN: 10 mg/dL (ref 6–24)
Bilirubin Total: 0.6 mg/dL (ref 0.0–1.2)
CO2: 24 mmol/L (ref 20–29)
Calcium: 9.5 mg/dL (ref 8.7–10.2)
Chloride: 102 mmol/L (ref 96–106)
Creatinine, Ser: 1 mg/dL (ref 0.76–1.27)
Globulin, Total: 2.8 g/dL (ref 1.5–4.5)
Glucose: 141 mg/dL — ABNORMAL HIGH (ref 70–99)
Potassium: 4.1 mmol/L (ref 3.5–5.2)
Sodium: 139 mmol/L (ref 134–144)
Total Protein: 7.4 g/dL (ref 6.0–8.5)
eGFR: 92 mL/min/{1.73_m2} (ref >59)

## 2021-03-17 LAB — HEMOGLOBIN A1C
Est. average glucose Bld gHb Est-mCnc: 160 mg/dL
Hgb A1c MFr Bld: 7.2 % — ABNORMAL HIGH (ref 4.8–5.6)

## 2021-03-17 NOTE — Patient Instructions (Signed)
Medication Instructions:  Continue all medications *If you need a refill on your cardiac medications before your next appointment, please call your pharmacy*   Lab Work: Cmet,lipid panel,cbc,Hgba1c today    Testing/Procedures: Schedule Coronary Calcium Score   Follow-Up: At [facility], you and your health needs are our priority.  As part of our continuing mission to provide you with exceptional heart care, we have created designated Provider Care Teams.  These Care Teams include your primary Cardiologist (physician) and Advanced Practice Providers (APPs -  Physician Assistants and Nurse Practitioners) who all work together to provide you with the care you need, when you need it.  We recommend signing up for the patient portal called "MyChart".  Sign up information is provided on this After Visit Summary.  MyChart is used to connect with patients for Virtual Visits (Telemedicine).  Patients are able to view lab/test results, encounter notes, upcoming appointments, etc.  Non-urgent messages can be sent to your provider as well.   To learn more about what you can do with MyChart, go to [email or web address].      Your next appointment:  1 year    The format for your next appointment: Office   Provider:  Dr.Hochrein

## 2021-04-17 ENCOUNTER — Telehealth: Payer: Self-pay | Admitting: *Deleted

## 2021-04-17 NOTE — Telephone Encounter (Signed)
Called patient to see what supplies he is actually needing. Upon reviewing his chart I see a phone note from July that [facility] sent a order over to [facility] for supplies. I called [facility] and was told by Duwayne Heck they have no order for this patient. It appears the sleep studies from [place] has not been received, therefore supplies cannot be provided unless they have the previous sleep studies or the patient has a new one. Patient was offered to come by the office to get a mask sample to use until we can get all of the documentation we need to send over to the DME company. Patient was also asked to bring his machine so I can get a download from it while he is here. He will bring the name of his previous DME company for me to try to get copies of his sleep study.

## 2021-04-21 ENCOUNTER — Other Ambulatory Visit: Payer: Self-pay | Admitting: Cardiology

## 2021-04-21 DIAGNOSIS — E785 Hyperlipidemia, unspecified: Secondary | ICD-10-CM

## 2021-04-25 ENCOUNTER — Other Ambulatory Visit: Payer: Self-pay

## 2021-04-25 ENCOUNTER — Ambulatory Visit (INDEPENDENT_AMBULATORY_CARE_PROVIDER_SITE_OTHER)
Admission: RE | Admit: 2021-04-25 | Discharge: 2021-04-25 | Disposition: A | Discharge status: Home / Self Care | Payer: Self-pay | Source: Ambulatory Visit | Attending: Cardiology | Admitting: Cardiology

## 2021-04-25 DIAGNOSIS — E785 Hyperlipidemia, unspecified: Secondary | ICD-10-CM

## 2021-04-25 DIAGNOSIS — M25519 Pain in unspecified shoulder: Secondary | ICD-10-CM

## 2021-04-25 DIAGNOSIS — R0602 Shortness of breath: Secondary | ICD-10-CM

## 2021-04-25 DIAGNOSIS — I10 Essential (primary) hypertension: Secondary | ICD-10-CM

## 2021-04-28 ENCOUNTER — Telehealth: Payer: Self-pay | Admitting: *Deleted

## 2021-04-28 DIAGNOSIS — E785 Hyperlipidemia, unspecified: Secondary | ICD-10-CM

## 2021-04-28 MED ORDER — EZETIMIBE 10 MG PO TABS: 10.0000 mg | ORAL_TABLET | Freq: Every day | ORAL | 3 refills | Status: DC

## 2021-04-28 NOTE — Telephone Encounter (Signed)
-----   Message from Rollene Rotunda, MD sent at 04/25/2021  8:51 PM EST ----- I would like to add Zetia 10 mg to his meds and repeat a lipid in 3 months.  He had moderate calcium.  I do not think that he needs another stress test however.  Call [name] with the results and send results to Deeann Saint, MD

## 2021-04-28 NOTE — Telephone Encounter (Signed)
pt aware of results  ?New script sent to the pharmacy  ?Lab orders mailed to the pt  ?Results forwarded  ?

## 2021-05-14 ENCOUNTER — Telehealth: Payer: BC Managed Care – PPO | Admitting: Family

## 2021-05-14 ENCOUNTER — Telehealth: Payer: BC Managed Care – PPO

## 2021-05-14 DIAGNOSIS — L255 Unspecified contact dermatitis due to plants, except food: Secondary | ICD-10-CM | POA: Diagnosis not present

## 2021-05-14 MED ORDER — TRIAMCINOLONE ACETONIDE 0.5 % EX OINT: 1.0000 "application " | TOPICAL_OINTMENT | Freq: Two times a day (BID) | CUTANEOUS | 0 refills | Status: AC

## 2021-05-14 MED ORDER — PREDNISONE 10 MG PO TABS: ORAL_TABLET | ORAL | 0 refills | Status: DC

## 2021-05-14 NOTE — Progress Notes (Signed)
?Virtual Visit Consent  ? ?Anthony Bishop, you are scheduled for a virtual visit with a [facility] provider today.   ?  ?Just as with appointments in the office, your consent must be obtained to participate.  Your consent will be active for this visit and any virtual visit you may have with one of our providers in the next 365 days.   ?  ?If you have a MyChart account, a copy of this consent can be sent to you electronically.  All virtual visits are billed to your insurance company just like a traditional visit in the office.   ? ?As this is a virtual visit, video technology does not allow for your provider to perform a traditional examination.  This may limit your provider's ability to fully assess your condition.  If your provider identifies any concerns that need to be evaluated in person or the need to arrange testing (such as labs, EKG, etc.), we will make arrangements to do so.   ?  ?Although advances in technology are sophisticated, we cannot ensure that it will always work on either your end or our end.  If the connection with a video visit is poor, the visit may have to be switched to a telephone visit.  With either a video or telephone visit, we are not always able to ensure that we have a secure connection.    ? ?I need to obtain your verbal consent now.   Are you willing to proceed with your visit today?  ?  ?Anthony Bishop has provided verbal consent on 05/14/2021 for a virtual visit (video or telephone). ?  ?Jannifer Rodney, FNP  ? ?Date: 05/14/2021 6:09 PM ? ? ?Virtual Visit via Video Note  ? ?IJannifer Rodney, connected with  Anthony Bishop  ([id number], 1970/03/09) on 05/14/21 at  6:00 PM EDT by a video-enabled telemedicine application and verified that I am speaking with the correct person using two identifiers. ? ?Location: ?Patient: Virtual Visit Location Patient: Home ?Provider: Virtual Visit Location Provider: Home Office ?  ?I discussed the limitations of  evaluation and management by telemedicine and the availability of in person appointments. The patient expressed understanding and agreed to proceed.   ? ?History of Present Illness: ?Anthony Bishop is a 51 y.o. who identifies as a male who was assigned male at birth, and is being seen today for rash. ? ?HPI: Rash ?This is a new problem. The current episode started in the past 7 days. The problem has been gradually worsening since onset. The affected locations include the left arm, back, neck, chest and right arm. The rash is characterized by redness, itchiness and blistering. He was exposed to plant contact.   ?Problems:  ?Patient Active Problem List  ? Diagnosis Date Noted  ? Shoulder pain 09/17/2019  ? Diaphoresis 09/17/2019  ? Educated about COVID-19 virus infection 09/03/2019  ? Near syncope 08/05/2019  ? AKI (acute kidney injury) (HCC) 08/05/2019  ? Hyperglycemia 08/05/2019  ? Chest pain 08/05/2019  ? Essential hypertension 02/12/2019  ? Hyperlipidemia 02/12/2019  ? Seasonal allergies 02/12/2019  ? Gastroesophageal reflux disease 02/12/2019  ? Arthritis 02/12/2019  ? Former smoker 02/12/2019  ?  ?Allergies:  ?Allergies  ?Allergen Reactions  ? Augmentin [Amoxicillin-Pot Clavulanate] Nausea And Vomiting  ? Lisinopril Other (See Comments)  ?  unknown  ? Losartan Other (See Comments)  ?  unknown  ? ?Medications:  ?Current Outpatient Medications:  ?  predniSONE (DELTASONE) 10 MG tablet, Days 1-4 take  4 tablets (40 mg) daily  Days 5-8 take 3 tablets (30 mg) daily, Days 9-11 take 2 tablets (20 mg) daily, Days 12-14 take 1 tablet (10 mg) daily., Disp: 37 tablet, Rfl: 0 ?  triamcinolone ointment (KENALOG) 0.5 %, Apply 1 application. topically 2 (two) times daily., Disp: 90 g, Rfl: 0 ?  amLODipine (NORVASC) 10 MG tablet, Take 1 tablet (10 mg total) by mouth daily., Disp: 90 tablet, Rfl: 3 ?  ezetimibe (ZETIA) 10 MG tablet, Take 1 tablet (10 mg total) by mouth daily., Disp: 90 tablet, Rfl: 3 ?  omeprazole  (PRILOSEC) 20 MG capsule, TAKE 1 CAPSULE BY MOUTH EVERY DAY, Disp: 90 capsule, Rfl: 3 ?  rosuvastatin (CRESTOR) 40 MG tablet, TAKE 1 TABLET(40 MG) BY MOUTH DAILY, Disp: 90 tablet, Rfl: 3 ? ?Observations/Objective: ?Patient is well-developed, well-nourished in no acute distress.  ?Resting comfortably  at home.  ?Head is normocephalic, atraumatic.  ?No labored breathing.  ?Speech is clear and coherent with logical content.  ?Patient is alert and oriented at baseline.  ?Bilateral arms erythemas with blisters  ? ?Assessment and Plan: ?1. Contact dermatitis due to plant ?- predniSONE (DELTASONE) 10 MG tablet; Days 1-4 take 4 tablets (40 mg) daily  Days 5-8 take 3 tablets (30 mg) daily, Days 9-11 take 2 tablets (20 mg) daily, Days 12-14 take 1 tablet (10 mg) daily.  Dispense: 37 tablet; Refill: 0 ?- triamcinolone ointment (KENALOG) 0.5 %; Apply 1 application. topically 2 (two) times daily.  Dispense: 90 g; Refill: 0 ? ?Avoid scratching  ?Keep clean and dry  ?Cool compresses ?Report any s/s of infeciton ? ?Follow Up Instructions: ?I discussed the assessment and treatment plan with the patient. The patient was provided an opportunity to ask questions and all were answered. The patient agreed with the plan and demonstrated an understanding of the instructions.  A copy of instructions were sent to the patient via MyChart unless otherwise noted below.  ? ? ? ?The patient was advised to call back or seek an in-person evaluation if the symptoms worsen or if the condition fails to improve as anticipated. ? ?Time:  ?I spent 8 minutes with the patient via telehealth technology discussing the above problems/concerns.   ? ?Jannifer Rodney, FNP ? ?

## 2021-07-05 IMAGING — CR DG CHEST 2V
2 series · 2 of 2 positions shown · non-contrast
Comparison: None.

CLINICAL DATA: Shortness of breath, chest pain

EXAM:
CHEST - 2 VIEW

[chest pa]
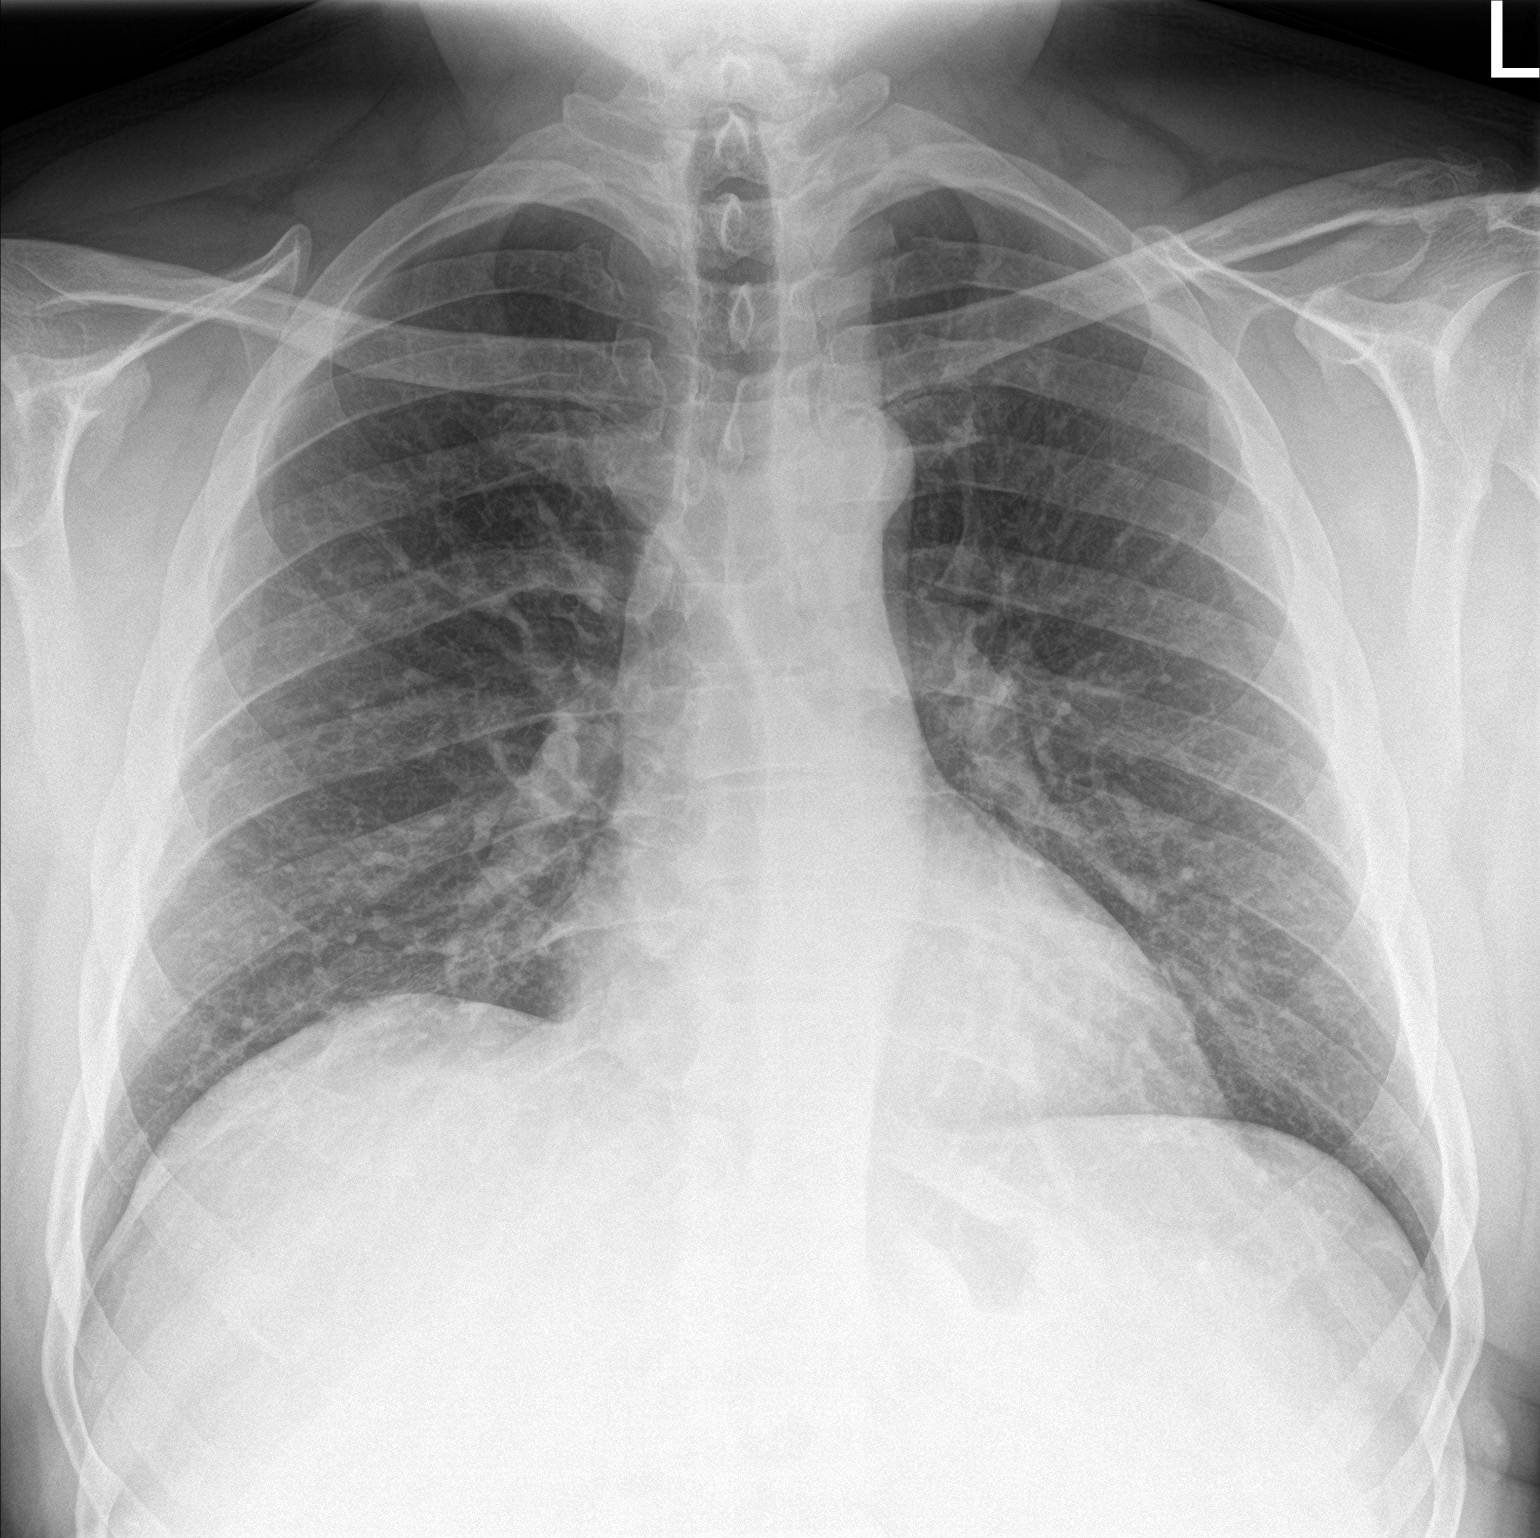

[chest lat]
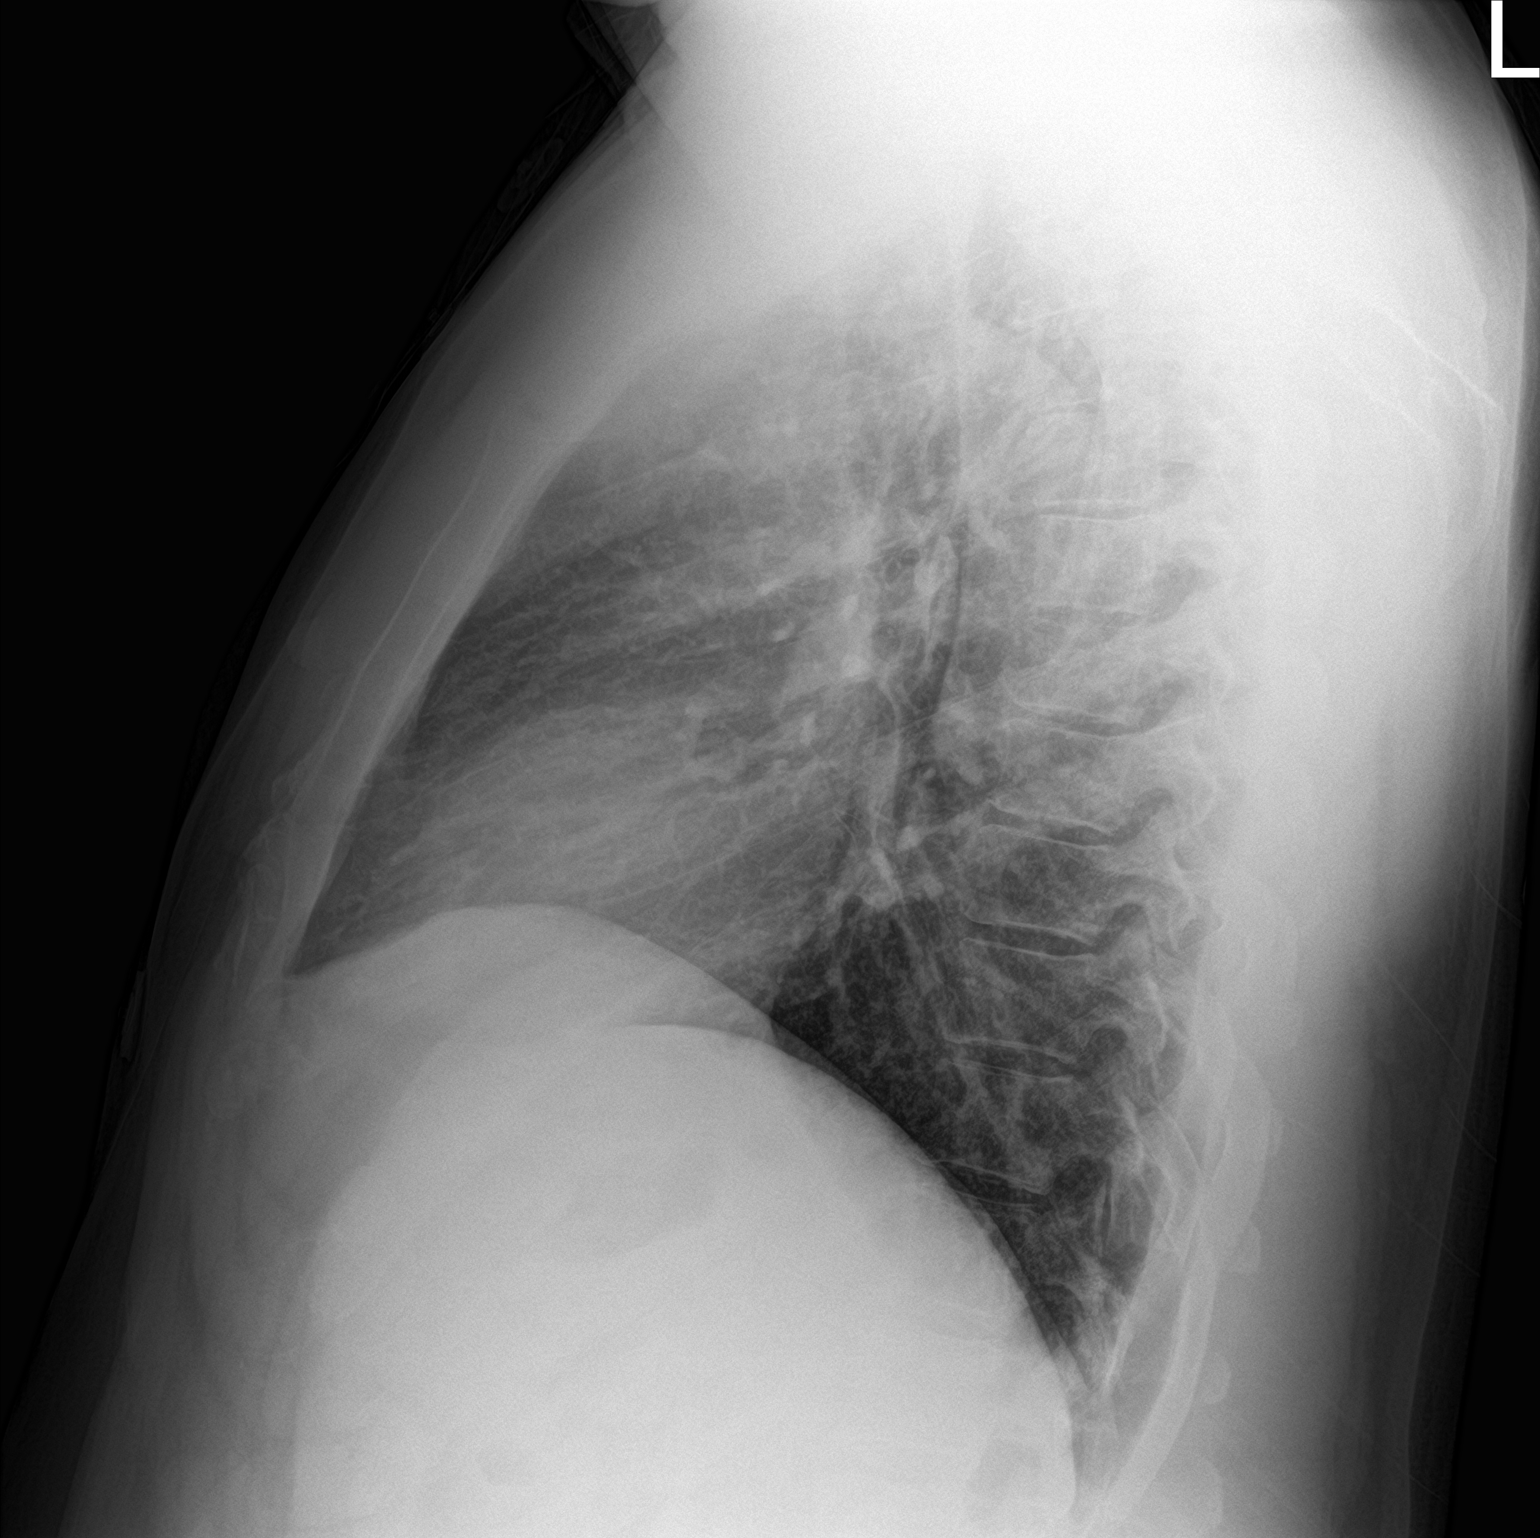

[2 of 2 positions shown; findings below may reference images not displayed]

FINDINGS: Heart and mediastinal contours are within normal limits. No focal
opacities or effusions. No acute bony abnormality.
IMPRESSION: No active cardiopulmonary disease.

## 2021-08-02 ENCOUNTER — Encounter: Payer: Self-pay | Admitting: *Deleted

## 2021-09-13 DIAGNOSIS — E785 Hyperlipidemia, unspecified: Secondary | ICD-10-CM | POA: Diagnosis not present

## 2021-09-13 LAB — LIPID PANEL
Chol/HDL Ratio: 3.3 ratio (ref 0.0–5.0)
Cholesterol, Total: 144 mg/dL (ref 100–199)
HDL: 44 mg/dL (ref >39)
LDL Chol Calc (NIH): 80 mg/dL (ref 0–99)
Triglycerides: 110 mg/dL (ref 0–149)
VLDL Cholesterol Cal: 20 mg/dL (ref 5–40)

## 2021-09-19 ENCOUNTER — Encounter: Payer: Self-pay | Admitting: *Deleted

## 2022-06-10 NOTE — Progress Notes (Signed)
  Cardiology Office Note:   Date:  06/11/2022  ID:  Anthony Bishop, DOB January 02, 1971, MRN [id number]  History of Present Illness:   Anthony Bishop is a 52 y.o. 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  He has had shoulder pain.  I sent him for a perfusion study.  He had a negative myoview in 2021.  He had an elevated coronary calcium in 2023 which was 78th percentile with a total score of 30.8.   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 The patient denies any new symptoms such as neck or arm discomfort. There has been no new shortness of breath, PND or orthopnea. There have been no reported palpitations, presyncope or syncope.  He had stress with a friend he had a stroke.  He forgets to take his medications frequently.  He has had a couple of episodes of sharp pain when he did not sleep.  This is infrequent and happens sporadically and does not happen with exercise.  ROS: As stated in the HPI and negative for all other systems.  Studies Reviewed:    EKG: Sinus rhythm, rate 69, leftward axis, left ventricular hypertrophy by voltage criteria, no acute ST-T wave changes.  Risk Assessment/Calculations:       Physical Exam:   VS:  BP (!) 160/120   Pulse 69   Ht 6\' 2"  (1.88 m)   Wt 289 lb 6.4 oz (131.3 kg)   SpO2 97%   BMI 37.16 kg/m    Wt Readings from Last 3 Encounters:  06/11/22 289 lb 6.4 oz (131.3 kg)  03/17/21 300 lb (136.1 kg)  05/16/20 288 lb 9.6 oz (130.9 kg)     GEN: Well nourished, well developed in no acute distress NECK: No JVD; No carotid bruits CARDIAC: RRR, no murmurs, rubs, gallops RESPIRATORY:  Clear to auscultation without rales, wheezing or rhonchi  ABDOMEN: Soft, non-tender, non-distended EXTREMITIES:  No edema; No deformity   ASSESSMENT AND PLAN:   ELEVATED CALCIUM:   He has no new cardiovascular symptoms.  We are going to participate with  aggressive risk reduction.    HTN: His blood pressure is elevated.  He gets take his medications.  He is going to consistently take his medications and we talked about setting an alarm on his phone, keeping some pills in his car.  Once he has been taking it consistently he is going to give me at 3 time a day blood pressure diary.  DYSLIPDEMIA:     LDL was 80/44.  I will repeat a lipid profile liver enzymes along with a chemistry and CBC in 3 months.  If he is not in the 50s I might make med changes.  However, he has not been consistently taking his statin.  I will also check an A1c when I see him back.  128 down from 213.  Goals of therapy will be based on the above.  I will be checking a lipid profile and liver enzymes today.    ELEVATED A1C:  I will check an A1c.  If it is elevated again I am going to consider starting metformin.  It was 7.2 when he saw him previously.   SLEEP APNEA: He wants a referral to a new sleep doctor and I will refer him to Dr. Vickey Huger      Signed, Rollene Rotunda, MD

## 2022-06-11 ENCOUNTER — Ambulatory Visit: Payer: BC Managed Care – PPO | Attending: Cardiology | Admitting: Cardiology

## 2022-06-11 ENCOUNTER — Encounter: Payer: Self-pay | Admitting: Cardiology

## 2022-06-11 VITALS — BP 160/120 | HR 69 | Ht 74.0 in | Wt 289.4 lb

## 2022-06-11 DIAGNOSIS — I10 Essential (primary) hypertension: Secondary | ICD-10-CM

## 2022-06-11 DIAGNOSIS — G4730 Sleep apnea, unspecified: Secondary | ICD-10-CM

## 2022-06-11 DIAGNOSIS — R0602 Shortness of breath: Secondary | ICD-10-CM

## 2022-06-11 DIAGNOSIS — E785 Hyperlipidemia, unspecified: Secondary | ICD-10-CM | POA: Diagnosis not present

## 2022-06-11 NOTE — Patient Instructions (Signed)
Medication Instructions:  Your physician recommends that you continue on your current medications as directed. Please refer to the Current Medication list given to you today.  *If you need a refill on your cardiac medications before your next appointment, please call your pharmacy*   Lab Work: Your physician recommends that you return for lab work in: 3 months for FASTING CMET, CBC, Lipids, A1C, LPa  If you have labs (blood work) drawn today and your tests are completely normal, you will receive your results only by: MyChart Message (if you have MyChart) OR A paper copy in the mail If you have any lab test that is abnormal or we need to change your treatment, we will call you to review the results.   Follow-Up: At [facility], you and your health needs are our priority.  As part of our continuing mission to provide you with exceptional heart care, we have created designated Provider Care Teams.  These Care Teams include your primary Cardiologist (physician) and Advanced Practice Providers (APPs -  Physician Assistants and Nurse Practitioners) who all work together to provide you with the care you need, when you need it.  We recommend signing up for the patient portal called "MyChart".  Sign up information is provided on this After Visit Summary.  MyChart is used to connect with patients for Virtual Visits (Telemedicine).  Patients are able to view lab/test results, encounter notes, upcoming appointments, etc.  Non-urgent messages can be sent to your provider as well.   To learn more about what you can do with MyChart, go to [email or web address].    Your next appointment:   12 month(s)  Provider:   Rollene Rotunda, MD

## 2022-07-03 ENCOUNTER — Ambulatory Visit (INDEPENDENT_AMBULATORY_CARE_PROVIDER_SITE_OTHER): Payer: BC Managed Care – PPO | Admitting: Neurology

## 2022-07-03 ENCOUNTER — Encounter: Payer: Self-pay | Admitting: Neurology

## 2022-07-03 VITALS — BP 140/78 | HR 77 | Ht 74.0 in | Wt 292.4 lb

## 2022-07-03 DIAGNOSIS — R519 Headache, unspecified: Secondary | ICD-10-CM | POA: Diagnosis not present

## 2022-07-03 DIAGNOSIS — G4719 Other hypersomnia: Secondary | ICD-10-CM | POA: Diagnosis not present

## 2022-07-03 DIAGNOSIS — J342 Deviated nasal septum: Secondary | ICD-10-CM | POA: Diagnosis not present

## 2022-07-03 DIAGNOSIS — G4733 Obstructive sleep apnea (adult) (pediatric): Secondary | ICD-10-CM

## 2022-07-03 DIAGNOSIS — R0683 Snoring: Secondary | ICD-10-CM | POA: Diagnosis not present

## 2022-07-03 MED ORDER — MOMETASONE FUROATE 50 MCG/ACT NA SUSP
2.0000 | Freq: Every day | NASAL | 12 refills | Status: AC
Start: 2022-07-03 — End: ?
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
## 2022-07-04 ENCOUNTER — Telehealth: Payer: Self-pay | Admitting: Neurology

## 2022-07-04 NOTE — Telephone Encounter (Signed)
LVM for pt to call back to schedule mail out   [employer] no auth req via [facility] ref # [id number]

## 2022-07-09 ENCOUNTER — Telehealth: Payer: Self-pay | Admitting: Cardiology

## 2022-07-09 DIAGNOSIS — E785 Hyperlipidemia, unspecified: Secondary | ICD-10-CM

## 2022-07-09 NOTE — Telephone Encounter (Signed)
*  STAT* If patient is at the pharmacy, call can be transferred to refill team.   1. Which medications need to be refilled? (please list name of each medication and dose if known)   amLODipine (NORVASC) 10 MG tablet (Expired)  (completely out) ezetimibe (ZETIA) 10 MG tablet (Expired) (a few days left) rosuvastatin (CRESTOR) 40 MG tablet  (a few days left)  2. Which pharmacy/location (including street and city if local pharmacy) is medication to be sent to?  [facility] [id number] - [place], [place] - [address]   3. Do they need a 30 day or 90 day supply?   90 day  Patient stated he is running out of these medications.

## 2022-07-10 MED ORDER — ROSUVASTATIN CALCIUM 40 MG PO TABS: 40.0000 mg | ORAL_TABLET | Freq: Every evening | ORAL | 3 refills | Status: DC

## 2022-07-10 MED ORDER — EZETIMIBE 10 MG PO TABS: 10.0000 mg | ORAL_TABLET | Freq: Every day | ORAL | 3 refills | Status: DC

## 2022-07-10 MED ORDER — AMLODIPINE BESYLATE 10 MG PO TABS: 10.0000 mg | ORAL_TABLET | Freq: Every day | ORAL | 3 refills | Status: DC

## 2022-07-10 NOTE — Telephone Encounter (Signed)
Refills requested- Refills sent for  amLODipine (NORVASC) 10 MG tablet (Expired)  (completely out) ezetimibe (ZETIA) 10 MG tablet (Expired) (a few days left) rosuvastatin (CRESTOR) 40 MG tablet

## 2022-07-10 NOTE — Telephone Encounter (Signed)
Patient is calling to check on status of refills. Original note never routed.

## 2022-07-11 ENCOUNTER — Ambulatory Visit: Payer: BC Managed Care – PPO | Admitting: Neurology

## 2022-07-11 DIAGNOSIS — G4733 Obstructive sleep apnea (adult) (pediatric): Secondary | ICD-10-CM

## 2022-07-11 DIAGNOSIS — R0683 Snoring: Secondary | ICD-10-CM

## 2022-07-11 DIAGNOSIS — G4719 Other hypersomnia: Secondary | ICD-10-CM

## 2022-07-11 DIAGNOSIS — J342 Deviated nasal septum: Secondary | ICD-10-CM

## 2022-07-11 DIAGNOSIS — R519 Headache, unspecified: Secondary | ICD-10-CM

## 2022-07-19 NOTE — Addendum Note (Signed)
Addended by: Melvyn Novas on: 07/19/2022 06:34 PM   Modules accepted: Orders

## 2022-07-19 NOTE — Procedures (Signed)
[facility]  Anthony Bishop  Male, 52 y.o., Jul 22, 1970 MRN: [id number]   HOME SLEEP TEST REPORT (  mail out test- by Watch PAT)   STUDY DATA:  07-18-2022   ORDERING CLINICIAN: Melvyn Novas, MD  REFERRING CLINICIAN: Melany Guernsey    CLINICAL INFORMATION/HISTORY: 07-03-2022: evaluation for TOC, excessive daytime sleepiness.  Chief concern according to patient :  My CPAP is 52 years old, I was tested in [place]- and now my machine no longer provides reliable pressure, it displays a message of end of life time".  The patient reports his sleepiness has increased, he has more frequent nocturia, and overall he finds his CPAP not to provide enough pressure.   The patient had the first sleep study on 07-21-2013 , he slept almost equal amounts in supine and non-supine position and his AHI while sleeping on his back was 14.8/h and in nonsupine position 7.1/h.  He had a total of 30 obstructive apneas, 9 central apneas, 34 hypopneas and the total AHI index per hour was 10.4/h.  This is mild apnea.   Heart rate varied between 39 and 70 bpm with a trend towards bradycardia.  Lowest oxygen saturation was 69%.  Longest duration of low oxygen was 1.6 minutes.  The patient had severe associated oxygen desaturation with an overall mild apnea and the recommendation was to undergo a CPAP titration.   The patient ended up with a CPAP setting of 12 cm water without expiratory relief function this machine is now 52 years old and he has used it compliantly 97% for the last days 93% by hours.  With an average of 6 hours 56 minutes each night the residual AHI is 1.8 which is excellent resolution.  I am reviewing a 90-day download here.     Epworth sleepiness score:14 /24. FSS at 36/ 63 points.    BMI: 37.5 kg/m   Neck Circumference: 19"   FINDINGS:   Sleep Summary:   Total Recording Time (hours, min):     8 hours 30 minutes   Total Sleep Time (hours, min): 6 hours 32 minutes                Percent REM (%): 27.5%   Sleep latency was 22 minutes and REM sleep latency 79 minutes, WASO= wakefulness after sleep onset -time was 78 minutes.                                     Respiratory Indices by AASM criteria:   Calculated pAHI (per hour):   16.5/h                          REM pAHI:      39/h                                           NREM pAHI:    7.7/h                          Positional AHI: Supine sleep was associated with an AHI of 33.4/h nonsupine sleep with an AHI of 4.1/h.  Snoring reached a mean volume of 40 dB which is at threshold and was present for about 10%  of total sleep time.                                                 Oxygen Saturation Statistics:  O2 Saturation Range (%):   Between a nadir at 81 and a maximum saturation of 98% with a mean saturation of 93%                                    O2 Saturation (minutes) <89%:   3.2 minutes        Pulse Rate Statistics:   Pulse Mean (bpm):   59 bpm              Pulse Range:    Between 40 and 83 bpm.             IMPRESSION:  This HST confirms the presence of mild to moderate sleep apnea which was strongly REM sleep dependent and supine sleep dependent. While avoiding supine sleep will help to reduce the AHI overall, the REM sleep dependent form of apnea will still require positive airway pressure therapy.    RECOMMENDATION: The patient should continue with positive airway pressure therapy and an auto titration CPAP device will be provided, between 5 and 15 cm water pressure with 3 cm EPR, humidifier, interface of patient's choice.  The patient will be invited to follow-up after 30 days and before 90 days of compliance data on CPAP therapy are available.     INTERPRETING PHYSICIAN:   Melvyn Novas, MD  [facility].

## 2022-07-23 ENCOUNTER — Telehealth: Payer: Self-pay

## 2022-07-23 NOTE — Telephone Encounter (Signed)
-----   Message from Melvyn Novas, MD sent at 07/19/2022  6:34 PM EDT ----- Apnea was confirmed as positional and REM sleep dependent. Moderate degree of OSA. New CPAP will be set for a range of pressure that includes the current setting and offers room to go- up or down - as needed with positional changes.

## 2022-07-23 NOTE — Telephone Encounter (Signed)
I called Anthony Bishop. I advised Anthony Bishop that Dr. Vickey Huger reviewed their sleep study results and found that Anthony Bishop has moderate OSA. Dr. Golden Hurter recommends that Anthony Bishop continues PAP. I reviewed PAP compliance expectations with the Anthony Bishop. Anthony Bishop is agreeable to starting a CPAP. I advised Anthony Bishop that an order will be sent to a DME, [facility], and [facility] will call the Anthony Bishop within about one week after they file with the Anthony Bishop's insurance. [facility] will show the Anthony Bishop how to use the machine, fit for masks, and troubleshoot the CPAP if needed. A follow up appt was made for insurance purposes with Dr. Vickey Huger on 8/13 8am. Anthony Bishop verbalized understanding to arrive 15 minutes early and bring their CPAP. Anthony Bishop verbalized understanding of results. Anthony Bishop had no questions at this time but was encouraged to call back if questions arise. I have sent the order to [facility] and have received confirmation that they have received the order.

## 2022-07-24 NOTE — Telephone Encounter (Signed)
Zott, Donette Larry, Abbe Amsterdam, CMA; Elsie Lincoln, [place] Got It Thank you       Previous Messages    ----- Message ----- From: Bobbye Morton, CMA Sent: 07/23/2022   3:31 PM EDT To: Marlou Porch Zott Subject: New autoPAP                                    New orders have been placed for the above pt, DOB:2070/03/27 Thanks

## 2022-07-26 DIAGNOSIS — L309 Dermatitis, unspecified: Secondary | ICD-10-CM | POA: Diagnosis not present

## 2022-07-26 DIAGNOSIS — I10 Essential (primary) hypertension: Secondary | ICD-10-CM | POA: Diagnosis not present

## 2022-07-26 DIAGNOSIS — Z6837 Body mass index (BMI) 37.0-37.9, adult: Secondary | ICD-10-CM | POA: Diagnosis not present

## 2022-07-26 DIAGNOSIS — Z1159 Encounter for screening for other viral diseases: Secondary | ICD-10-CM | POA: Diagnosis not present

## 2022-07-26 DIAGNOSIS — R109 Unspecified abdominal pain: Secondary | ICD-10-CM | POA: Diagnosis not present

## 2022-07-26 DIAGNOSIS — R198 Other specified symptoms and signs involving the digestive system and abdomen: Secondary | ICD-10-CM | POA: Diagnosis not present

## 2022-07-26 DIAGNOSIS — E785 Hyperlipidemia, unspecified: Secondary | ICD-10-CM | POA: Diagnosis not present

## 2022-08-09 DIAGNOSIS — G4733 Obstructive sleep apnea (adult) (pediatric): Secondary | ICD-10-CM | POA: Diagnosis not present

## 2022-08-09 DIAGNOSIS — R400 Somnolence: Secondary | ICD-10-CM | POA: Diagnosis not present

## 2022-08-24 DIAGNOSIS — I10 Essential (primary) hypertension: Secondary | ICD-10-CM | POA: Diagnosis not present

## 2022-08-24 DIAGNOSIS — R109 Unspecified abdominal pain: Secondary | ICD-10-CM | POA: Diagnosis not present

## 2022-08-24 DIAGNOSIS — R198 Other specified symptoms and signs involving the digestive system and abdomen: Secondary | ICD-10-CM | POA: Diagnosis not present

## 2022-08-24 DIAGNOSIS — E119 Type 2 diabetes mellitus without complications: Secondary | ICD-10-CM | POA: Diagnosis not present

## 2022-09-05 ENCOUNTER — Telehealth: Payer: Self-pay | Admitting: Neurology

## 2022-09-05 NOTE — Telephone Encounter (Signed)
LVM and sent mychart msg informing pt of need to reschedule 10/09/22 appt - MD out  There is a slot held for him with Dr. Vickey Huger for 11/06/22 at 9am that he can have

## 2022-09-08 DIAGNOSIS — G4733 Obstructive sleep apnea (adult) (pediatric): Secondary | ICD-10-CM | POA: Diagnosis not present

## 2022-10-03 DIAGNOSIS — K580 Irritable bowel syndrome with diarrhea: Secondary | ICD-10-CM | POA: Diagnosis not present

## 2022-10-03 DIAGNOSIS — R198 Other specified symptoms and signs involving the digestive system and abdomen: Secondary | ICD-10-CM | POA: Diagnosis not present

## 2022-10-03 DIAGNOSIS — Z1211 Encounter for screening for malignant neoplasm of colon: Secondary | ICD-10-CM | POA: Diagnosis not present

## 2022-10-03 DIAGNOSIS — R109 Unspecified abdominal pain: Secondary | ICD-10-CM | POA: Diagnosis not present

## 2022-10-09 ENCOUNTER — Encounter: Payer: BC Managed Care – PPO | Admitting: Neurology

## 2022-10-09 DIAGNOSIS — G4733 Obstructive sleep apnea (adult) (pediatric): Secondary | ICD-10-CM | POA: Diagnosis not present

## 2022-10-16 ENCOUNTER — Encounter: Payer: BC Managed Care – PPO | Admitting: Neurology

## 2022-10-24 DIAGNOSIS — K219 Gastro-esophageal reflux disease without esophagitis: Secondary | ICD-10-CM | POA: Diagnosis not present

## 2022-10-24 DIAGNOSIS — I10 Essential (primary) hypertension: Secondary | ICD-10-CM | POA: Diagnosis not present

## 2022-10-24 DIAGNOSIS — E119 Type 2 diabetes mellitus without complications: Secondary | ICD-10-CM | POA: Diagnosis not present

## 2022-10-24 DIAGNOSIS — R198 Other specified symptoms and signs involving the digestive system and abdomen: Secondary | ICD-10-CM | POA: Diagnosis not present

## 2022-10-24 DIAGNOSIS — R109 Unspecified abdominal pain: Secondary | ICD-10-CM | POA: Diagnosis not present

## 2022-11-09 DIAGNOSIS — G4733 Obstructive sleep apnea (adult) (pediatric): Secondary | ICD-10-CM | POA: Diagnosis not present

## 2022-11-14 DIAGNOSIS — I10 Essential (primary) hypertension: Secondary | ICD-10-CM | POA: Diagnosis not present

## 2022-11-14 DIAGNOSIS — E119 Type 2 diabetes mellitus without complications: Secondary | ICD-10-CM | POA: Diagnosis not present

## 2022-11-21 ENCOUNTER — Encounter: Payer: BC Managed Care – PPO | Admitting: Neurology

## 2022-11-21 ENCOUNTER — Telehealth: Payer: Self-pay | Admitting: Neurology

## 2022-11-21 ENCOUNTER — Encounter: Payer: Self-pay | Admitting: Neurology

## 2022-11-21 NOTE — Telephone Encounter (Signed)
Pt has called to report he will not make appointment today, he mixed up his days.  Pt was reminded that this appointment was supposed to have been done by 9-6.  Pt was told a message would be send to Dr Dohmeier's POD and that someone would call him back to discuss what needed to be done.

## 2022-11-21 NOTE — Telephone Encounter (Signed)
Pt was called back, after checking DPR a voice message was left asking pt to call so he could be advised that he will need to speak with DME at this point regarding what he will be advised about now being out of the 31-90 day time frame of initial CPAP f/u.

## 2022-11-21 NOTE — Telephone Encounter (Signed)
Pt called back, the message was relayed to him that he needs to contact DME and see what they advise him to do. This update is FYI to POD 3, no call back requested

## 2022-11-22 ENCOUNTER — Telehealth: Payer: Self-pay | Admitting: Neurology

## 2022-11-22 DIAGNOSIS — Z0289 Encounter for other administrative examinations: Secondary | ICD-10-CM

## 2022-11-22 NOTE — Telephone Encounter (Signed)
Pt called to reschedule Initial CPAP appt due to no show. Transferred pt to Billing to pay no show fee of $50.

## 2022-11-29 ENCOUNTER — Encounter: Payer: Self-pay | Admitting: Adult Health

## 2022-11-29 ENCOUNTER — Ambulatory Visit: Payer: BC Managed Care – PPO | Admitting: Adult Health

## 2022-11-29 VITALS — BP 145/96 | HR 85 | Ht 74.0 in | Wt 288.0 lb

## 2022-11-29 DIAGNOSIS — G4733 Obstructive sleep apnea (adult) (pediatric): Secondary | ICD-10-CM | POA: Diagnosis not present

## 2022-11-29 NOTE — Patient Instructions (Addendum)
Your Plan:  Continue nightly use of CPAP for adequate sleep apnea management  Continue to follow with your DME company for any needed supplies or CPAP related concerns    Follow-up in 1 year or call earlier if needed    Thank you for coming to see [place] at [facility]. I hope we have been able to provide you high quality care today.  You may receive a patient satisfaction survey over the next few weeks. We would appreciate your feedback and comments so that we may continue to improve ourselves and the health of our patients.

## 2022-11-29 NOTE — Progress Notes (Signed)
[facility] [address] [place]. [place] [address] [phone]       OFFICE FOLLOW UP NOTE  [name] [facility] Date of Birth:  09-28-1970 Medical Record Number:  [id number]    Primary neurologist: Dr. Vickey Huger Reason for visit: Initial CPAP follow-up    SUBJECTIVE:   CHIEF COMPLAINT:  Chief Complaint  Patient presents with   Obstructive Sleep Apnea    Rm 3 alone Pt is well and stable, reports no concerns with OSA/CPAP.     Follow-up visit:  Prior visit: 07/03/2022 with Dr. Vickey Huger  Brief HPI:   Anthony Bishop is a 52 y.o. male who was initially evaluated by Dr. Vickey Huger on 07/03/2022 with diagnosis of sleep apnea in 2015 with use of CPAP with improvement of snoring, apnea and excessive daytime sleepiness but complained of increased nocturia and morning headaches, and regarding the efficacy of old CPAP machine.  ESS 14/24. FSS 36/63.  HST 07/11/2022 showed total AHI of 16.5/h, REM AHI 39/h, and supine AHI 33.4/h, O2 nadir 81%.  Recommended continuation of PAP therapy.  AutoPap set up date 08/07/2022. DME [facility].    Interval history:  Compliance report shows excellent usage with optimal residual AHI.  Reports doing well with current CPAP.  Tolerating FFM well. Denies any recent morning headaches. Sleeping better most nights, still has some nocturia. ESS today 7/24 (prior 14/24). No questions or concerns today.                 ROS:   14 system review of systems performed and negative with exception of those listed in HPI  PMH:  Past Medical History:  Diagnosis Date   Arthritis    GERD (gastroesophageal reflux disease)    Hyperlipidemia    Hypertension    Sleep apnea     PSH: History reviewed. No pertinent surgical history.  Social History:  Social History   Socioeconomic History   Marital status: Married    Spouse name: Not on file   Number of children: Not on file   Years of education: Not on file    Highest education level: Not on file  Occupational History   Not on file  Tobacco Use   Smoking status: Former   Smokeless tobacco: Never  Substance and Sexual Activity   Alcohol use: Yes   Drug use: Never   Sexual activity: Yes  Other Topics Concern   Not on file  Social History Narrative   Not on file   Social Determinants of Health   Financial Resource Strain: Not on file  Food Insecurity: No Food Insecurity (10/24/2022)   Received from [facility]   Hunger Vital Sign    Worried About Running Out of Food in the Last Year: Never true    Ran Out of Food in the Last Year: Never true  Transportation Needs: No Transportation Needs (07/26/2022)   Received from [facility], [facility]   PRAPARE - Transportation    Lack of Transportation (Medical): No    Lack of Transportation (Non-Medical): No  Physical Activity: Not on file  Stress: No Stress Concern Present (07/26/2022)   Received from [facility], [facility]   [facility] of Occupational Health - Occupational Stress Questionnaire    Feeling of Stress : Not at all  Social Connections: Not on file  Intimate Partner Violence: Not At Risk (07/26/2022)   Received from [facility], [facility]   Humiliation, Afraid, Rape, and Melody Haver  questionnaire    Fear of Current or Ex-Partner: No    Emotionally Abused: No    Physically Abused: No    Sexually Abused: No    Family History:  Family History  Problem Relation Age of Onset   Miscarriages / Stillbirths Mother    Hypertension Mother    Hyperlipidemia Mother    Diabetes Mother    Aortic aneurysm Mother        Died at the time of a stent in the aorta   Alcohol abuse Father    Depression Father    Drug abuse Father    Heart disease Father        No details    Hypertension Father    Alcohol abuse Sister    Asthma Sister    Drug abuse Sister    Hypertension Sister    Heart disease Maternal Grandmother    Kidney disease Maternal Grandmother     Alzheimer's disease Maternal Grandmother    Kidney disease Maternal Grandfather    Stroke Maternal Grandfather     Medications:   Current Outpatient Medications on File Prior to Visit  Medication Sig Dispense Refill   amLODipine (NORVASC) 10 MG tablet Take 1 tablet (10 mg total) by mouth daily. 90 tablet 3   ezetimibe (ZETIA) 10 MG tablet Take 1 tablet (10 mg total) by mouth daily. 90 tablet 3   glipiZIDE (GLUCOTROL XL) 5 MG 24 hr tablet Take 5 mg by mouth daily.     mometasone (NASONEX) 50 MCG/ACT nasal spray Place 2 sprays into the nose daily. 1 each 12   omeprazole (PRILOSEC) 20 MG capsule TAKE 1 CAPSULE BY MOUTH EVERY DAY 90 capsule 3   rosuvastatin (CRESTOR) 40 MG tablet Take 1 tablet (40 mg total) by mouth every evening. 90 tablet 3   spironolactone (ALDACTONE) 25 MG tablet Take 25 mg by mouth daily.     triamcinolone ointment (KENALOG) 0.5 % Apply 1 application. topically 2 (two) times daily. 90 g 0   No current facility-administered medications on file prior to visit.    Allergies:   Allergies  Allergen Reactions   Augmentin [Amoxicillin-Pot Clavulanate] Nausea And Vomiting   Lisinopril Other (See Comments)    unknown   Losartan Other (See Comments)    unknown      OBJECTIVE:  Physical Exam  Vitals:   11/29/22 1321 11/29/22 1331  BP: (!) 148/93 (!) 145/96  Pulse: 88 85  Weight: 288 lb (130.6 kg)   Height: 6\' 2"  (1.88 m)    Body mass index is 36.98 kg/m. No results found.   General: well developed, well nourished, very pleasant middle-age male, seated, in no evident distress  Neurologic Exam Mental Status: Awake and fully alert. Oriented to place and time. Recent and remote memory intact. Attention span, concentration and fund of knowledge appropriate. Mood and affect appropriate.  Cranial Nerves: Pupils equal, briskly reactive to light. Extraocular movements full without nystagmus. Visual fields full to confrontation. Hearing intact. Facial sensation  intact. Face, tongue, palate moves normally and symmetrically.  Motor: Normal bulk and tone. Normal strength in all tested extremity muscles Gait and Station: Arises from chair without difficulty. Stance is normal. Gait demonstrates normal stride length and balance without use of AD.  Reflexes: 1+ and symmetric. Toes downgoing.         ASSESSMENT/PLAN: Graylon Amie is a 52 y.o. year old male    OSA on CPAP : Compliance report shows satisfactory usage with optimal residual AHI.  Continue current pressure settings.  Discussed continued nightly usage with ensuring greater than 4 hours nightly for optimal benefit and per insurance purposes.  Continue to follow with DME company for any needed supplies or CPAP related concerns     Follow up in 1 year or call earlier if needed   CC:  PCP: Deeann Saint, MD    I spent 20 minutes of face-to-face and non-face-to-face time with patient.  This included previsit chart review, lab review, study review, order entry, electronic health record documentation, patient education and discussion regarding above diagnoses and treatment plan and answered all other questions to patient's satisfaction  Ihor Austin, [facility]  [facility] [address] [place], [place] [address]  Phone [phone] Fax [phone] Note: This document was prepared with digital dictation and possible smart phrase technology. Any transcriptional errors that result from this process are unintentional.

## 2022-12-09 DIAGNOSIS — G4733 Obstructive sleep apnea (adult) (pediatric): Secondary | ICD-10-CM | POA: Diagnosis not present

## 2023-01-03 DIAGNOSIS — Z880 Allergy status to penicillin: Secondary | ICD-10-CM | POA: Diagnosis not present

## 2023-01-03 DIAGNOSIS — R1013 Epigastric pain: Secondary | ICD-10-CM | POA: Diagnosis not present

## 2023-01-03 DIAGNOSIS — Z888 Allergy status to other drugs, medicaments and biological substances status: Secondary | ICD-10-CM | POA: Diagnosis not present

## 2023-01-03 DIAGNOSIS — K219 Gastro-esophageal reflux disease without esophagitis: Secondary | ICD-10-CM | POA: Diagnosis not present

## 2023-01-03 DIAGNOSIS — K2270 Barrett's esophagus without dysplasia: Secondary | ICD-10-CM | POA: Diagnosis not present

## 2023-01-03 DIAGNOSIS — E669 Obesity, unspecified: Secondary | ICD-10-CM | POA: Diagnosis not present

## 2023-01-03 DIAGNOSIS — I10 Essential (primary) hypertension: Secondary | ICD-10-CM | POA: Diagnosis not present

## 2023-01-03 DIAGNOSIS — Z1211 Encounter for screening for malignant neoplasm of colon: Secondary | ICD-10-CM | POA: Diagnosis not present

## 2023-01-03 DIAGNOSIS — Z6835 Body mass index (BMI) 35.0-35.9, adult: Secondary | ICD-10-CM | POA: Diagnosis not present

## 2023-01-03 DIAGNOSIS — K2930 Chronic superficial gastritis without bleeding: Secondary | ICD-10-CM | POA: Diagnosis not present

## 2023-01-03 DIAGNOSIS — E785 Hyperlipidemia, unspecified: Secondary | ICD-10-CM | POA: Diagnosis not present

## 2023-01-03 DIAGNOSIS — Z79899 Other long term (current) drug therapy: Secondary | ICD-10-CM | POA: Diagnosis not present

## 2023-01-09 DIAGNOSIS — G4733 Obstructive sleep apnea (adult) (pediatric): Secondary | ICD-10-CM | POA: Diagnosis not present

## 2023-01-24 DIAGNOSIS — I10 Essential (primary) hypertension: Secondary | ICD-10-CM | POA: Diagnosis not present

## 2023-01-24 DIAGNOSIS — E785 Hyperlipidemia, unspecified: Secondary | ICD-10-CM | POA: Diagnosis not present

## 2023-01-24 DIAGNOSIS — G4733 Obstructive sleep apnea (adult) (pediatric): Secondary | ICD-10-CM | POA: Diagnosis not present

## 2023-01-24 DIAGNOSIS — E119 Type 2 diabetes mellitus without complications: Secondary | ICD-10-CM | POA: Diagnosis not present

## 2023-02-19 DIAGNOSIS — G4733 Obstructive sleep apnea (adult) (pediatric): Secondary | ICD-10-CM | POA: Diagnosis not present

## 2023-02-19 DIAGNOSIS — R400 Somnolence: Secondary | ICD-10-CM | POA: Diagnosis not present

## 2023-08-14 ENCOUNTER — Other Ambulatory Visit: Payer: Self-pay | Admitting: Cardiology

## 2023-08-14 DIAGNOSIS — E785 Hyperlipidemia, unspecified: Secondary | ICD-10-CM

## 2023-08-16 ENCOUNTER — Other Ambulatory Visit: Payer: Self-pay | Admitting: Cardiology

## 2023-08-16 DIAGNOSIS — E785 Hyperlipidemia, unspecified: Secondary | ICD-10-CM

## 2023-11-26 ENCOUNTER — Other Ambulatory Visit: Payer: Self-pay | Admitting: Cardiology

## 2023-11-26 DIAGNOSIS — E785 Hyperlipidemia, unspecified: Secondary | ICD-10-CM

## 2023-12-05 ENCOUNTER — Encounter: Payer: Self-pay | Admitting: Adult Health

## 2023-12-05 ENCOUNTER — Telehealth: Payer: BC Managed Care – PPO | Admitting: Adult Health

## 2023-12-05 DIAGNOSIS — G4733 Obstructive sleep apnea (adult) (pediatric): Secondary | ICD-10-CM | POA: Diagnosis not present

## 2023-12-05 NOTE — Progress Notes (Signed)
 Anthony Bishop D, CMA  Zott, [name], [name]; Darrel Boyer New orders have been placed for the above pt, DOB: 31-May-2070 Thanks

## 2023-12-05 NOTE — Progress Notes (Signed)
 [facility] [address] [place]. [place] [address] [phone]       OFFICE FOLLOW UP NOTE  [name] [facility] Date of Birth:  1970-12-20 Medical Record Number:  [id number]    Primary neurologist: Dr. Chalice Reason for visit: CPAP follow-up  Virtual Visit via Video Note  I connected with Anthony Bishop [facility] on 12/05/23 at  2:15 PM EDT by a video enabled telemedicine application and verified that I am speaking with the correct person using two identifiers.  Location: Patient: at home Provider: in office, [facility]   I discussed the limitations of evaluation and management by telemedicine and the availability of in person appointments. The patient expressed understanding and agreed to proceed.    SUBJECTIVE:   Follow-up visit:  Prior visit: 11/29/2022  Brief HPI:   Anthony Bishop is a 53 y.o. male who was initially evaluated by Dr. Chalice on 07/03/2022 with diagnosis of sleep apnea in 2015 with use of CPAP with improvement of snoring, apnea and excessive daytime sleepiness but complained of increased nocturia and morning headaches, and regarding the efficacy of old CPAP machine.  ESS 14/24. FSS 36/63.  HST 07/11/2022 showed total AHI of 16.5/h, REM AHI 39/h, and supine AHI 33.4/h, O2 nadir 81%.  Recommended continuation of PAP therapy.  AutoPap set up date 08/07/2022. DME [facility].    Interval history:  Patient returns via MyChart video visit for 1 year CPAP follow-up.  Compliance report shows excellent usage with optimal residual AHI.  Reports doing well with current CPAP. At times he can feel like he is not getting enough air when he first puts it on but otherwise tolerating well.  Denies any recent morning headaches. Sleeping better most nights, still has some nocturia. ESS today 6/24 (prior 14/24). No further questions or concerns today.                   ROS:   14 system review of systems performed and negative  with exception of those listed in HPI  PMH:  Past Medical History:  Diagnosis Date   Arthritis    GERD (gastroesophageal reflux disease)    Hyperlipidemia    Hypertension    Sleep apnea     PSH: No past surgical history on file.  Social History:  Social History   Socioeconomic History   Marital status: Married    Spouse name: Not on file   Number of children: Not on file   Years of education: Not on file   Highest education level: Not on file  Occupational History   Not on file  Tobacco Use   Smoking status: Former   Smokeless tobacco: Never  Substance and Sexual Activity   Alcohol use: Yes   Drug use: Never   Sexual activity: Yes  Other Topics Concern   Not on file  Social History Narrative   Not on file   Social Drivers of Health   Financial Resource Strain: Low Risk  (08/06/2023)   Received [facility]   Overall Financial Resource Strain (CARDIA)    Difficulty of Paying Living Expenses: Not very hard  Food Insecurity: No Food Insecurity (08/06/2023)   Received from [facility]   Hunger Vital Sign    Within the past 12 months, you worried that your food would run out before you got the money to buy more.: Never true    Within the past 12 months, the food you bought just didn't last and you didn't  have money to get more.: Never true  Transportation Needs: Unmet Transportation Needs (08/06/2023)   Received from [facility]   [facility] - Transportation    Lack of Transportation (Medical): No    Lack of Transportation (Non-Medical): Yes  Physical Activity: Not on file  Stress: No Stress Concern Present (08/07/2023)   Received from [facility] of Occupational Health - Occupational Stress Questionnaire    Feeling of Stress : Not at all  Social Connections: Not on file  Intimate Partner Violence: Not At Risk (08/07/2023)   Received from [facility]   Humiliation, Afraid, Rape, and Kick questionnaire    Within the last year,  have you been afraid of your partner or ex-partner?: No    Within the last year, have you been humiliated or emotionally abused in other ways by your partner or ex-partner?: No    Within the last year, have you been kicked, hit, slapped, or otherwise physically hurt by your partner or ex-partner?: No    Within the last year, have you been raped or forced to have any kind of sexual activity by your partner or ex-partner?: No    Family History:  Family History  Problem Relation Age of Onset   Miscarriages / Stillbirths Mother    Hypertension Mother    Hyperlipidemia Mother    Diabetes Mother    Aortic aneurysm Mother        Died at the time of a stent in the aorta   Alcohol abuse Father    Depression Father    Drug abuse Father    Heart disease Father        No details    Hypertension Father    Alcohol abuse Sister    Asthma Sister    Drug abuse Sister    Hypertension Sister    Heart disease Maternal Grandmother    Kidney disease Maternal Grandmother    Alzheimer's disease Maternal Grandmother    Kidney disease Maternal Grandfather    Stroke Maternal Grandfather     Medications:   Current Outpatient Medications on File Prior to Visit  Medication Sig Dispense Refill   amLODipine  (NORVASC ) 10 MG tablet TAKE 1 TABLET(10 MG) BY MOUTH DAILY 30 tablet 0   ezetimibe  (ZETIA ) 10 MG tablet TAKE 1 TABLET(10 MG) BY MOUTH DAILY 15 tablet 0   glipiZIDE (GLUCOTROL XL) 5 MG 24 hr tablet Take 5 mg by mouth daily.     mometasone  (NASONEX ) 50 MCG/ACT nasal spray Place 2 sprays into the nose daily. 1 each 12   omeprazole  (PRILOSEC) 20 MG capsule TAKE 1 CAPSULE BY MOUTH EVERY DAY 90 capsule 3   rosuvastatin  (CRESTOR ) 40 MG tablet TAKE 1 TABLET(40 MG) BY MOUTH EVERY EVENING 30 tablet 0   spironolactone (ALDACTONE) 25 MG tablet Take 25 mg by mouth daily.     triamcinolone  ointment (KENALOG ) 0.5 % Apply 1 application. topically 2 (two) times daily. 90 g 0   No current facility-administered  medications on file prior to visit.    Allergies:   Allergies  Allergen Reactions   Augmentin [Amoxicillin-Pot Clavulanate] Nausea And Vomiting   Lisinopril Other (See Comments)    unknown   Losartan Other (See Comments)    unknown      OBJECTIVE:  Physical Exam  General: well developed, well nourished, very pleasant middle-age male, seated, in no evident distress  Neurologic Exam Mental Status: Awake and fully alert. Oriented to place and time. Recent and  remote memory intact. Attention span, concentration and fund of knowledge appropriate. Mood and affect appropriate.        ASSESSMENT/PLAN: Anthony Bishop is a 53 y.o. year old male    OSA on CPAP :  Compliance report shows satisfactory usage with optimal residual AHI.   Continue current pressure settings of 5-15 with EPR 3.  Will adjust ramp pressure from 4 to 5. Advised to call if he continues to feel like there is not enough pressure when he first puts mask on.  Discussed continued nightly usage with ensuring greater than 4 hours nightly for optimal benefit and per insurance purposes.   Continue to follow with DME company [facility] for any needed supplies or CPAP related concerns  CPAP set up 08/2022     Follow up in 1 year or call earlier if needed     Harlene Bogaert, [facility]  [facility] [address] [place], [place] [address]  Phone [phone] Fax [phone] Note: This document was prepared with digital dictation and possible smart phrase technology. Any transcriptional errors that result from this process are unintentional.

## 2023-12-09 ENCOUNTER — Encounter: Payer: Self-pay | Admitting: Podiatry

## 2023-12-09 ENCOUNTER — Ambulatory Visit (INDEPENDENT_AMBULATORY_CARE_PROVIDER_SITE_OTHER)

## 2023-12-09 ENCOUNTER — Ambulatory Visit: Admitting: Podiatry

## 2023-12-09 DIAGNOSIS — M62461 Contracture of muscle, right lower leg: Secondary | ICD-10-CM

## 2023-12-09 DIAGNOSIS — M722 Plantar fascial fibromatosis: Secondary | ICD-10-CM | POA: Diagnosis not present

## 2023-12-09 DIAGNOSIS — M7751 Other enthesopathy of right foot: Secondary | ICD-10-CM

## 2023-12-09 DIAGNOSIS — M778 Other enthesopathies, not elsewhere classified: Secondary | ICD-10-CM

## 2023-12-09 MED ORDER — TRIAMCINOLONE ACETONIDE 10 MG/ML IJ SUSP
10.0000 mg | Freq: Once | INTRAMUSCULAR | Status: AC
  Administered 2023-12-09: 10 mg

## 2023-12-09 MED ORDER — MELOXICAM 15 MG PO TABS
15.0000 mg | ORAL_TABLET | Freq: Every day | ORAL | 0 refills | Status: AC
Start: 2023-12-09 — End: ?

## 2023-12-09 NOTE — Patient Instructions (Signed)

## 2023-12-09 NOTE — Progress Notes (Signed)
 Subjective:  Patient ID: Anthony Bishop, male    DOB: January 28, 1971,  MRN: [id number]  Chief Complaint  Patient presents with   Plantar Fasciitis    Left foot, heel pain to the lateal side, worse after rest and mornings. Describing classic PF pain. States he has tried everything, no night splint. No inserts, he did ice some and some stretching.  A1c was 7.2 on 9/11 No anti coag.     Discussed the use of AI scribe software for clinical note transcription with the patient, who gave verbal consent to proceed.  History of Present Illness Anthony Bishop is a 53 year old male with diabetes who presents with right heel pain.  He has been experiencing right heel pain for the past five to six months, localized to the outside bottom of the foot. The pain occasionally radiates to the inside of the foot and is associated with swelling and increased pain in the ankle after prolonged activity. He has been using ibuprofen, ice, ankle rolls, and hamstring stretches for management, but these have not provided significant relief.  No numbness in his feet. The pain is not present in the middle of the foot or the back, except after extended periods of activity. He typically wears steel-toed work boots and goes barefoot at home, rarely using his [employer] shoes.  His past medical history includes diabetes and low back pain. 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      Objective:    Physical Exam EXTREMITIES: No symptomatic limitations in pedal range of motion outside of tight heel cord with less than 10 degrees ankle joint dorsiflexion with knee extended.  Tenderness on palpation of right heel plantar medial, plantar central and plantar lateral aspect DP and PT pulses intact.  Digital hair growth present.  Cap refill intact less than 3 seconds. MUSCULOSKELETAL: Gastrocnemius equinus intact. High arch foot  type. NEUROLOGICAL: Protective sensation and light touch sensation intact. Neurovascular status intact.   No images are attached to the encounter.    Results RADIOLOGY Foot X-ray right foot 3 views weightbearing: Chronic calcaneal spur, high arch cavus foot type.  No acute fractures.  Normal osseous mineralization.  Joint spaces preserved.   Assessment:   1. Plantar fasciitis of right foot   2. Gastrocnemius equinus, right      Plan:  Patient was evaluated and treated and all questions answered.  Assessment and Plan Assessment & Plan Right plantar fasciitis Chronic right plantar fasciitis for 5-6 months with pain localized to the outside of the heel, radiating to the inside of the foot. Tenderness on the inside area of the heel and tightness in the Achilles tendon. X-rays show chronic heel spur, indicative of tension on the calcaneus from the plantar fascia. Differential diagnosis includes chronic heel pain potentially related to low back issues. - Administer steroid injection to the affected area, noting potential elevation of blood sugar for a few days to a week. - Prescribe meloxicam for anti-inflammatory treatment.  15 mg once a day take in morning with food and water, 30 tablets - Fit for Power Steps over-the-counter inserts to support plantar fascia and maintain arch support. - Provide a stretching packet focusing on Achilles and plantar fascia stretching exercises.  Reviewed with patient and depth - Recommend using a frozen water bottle as a massage roller and exercises to strengthen foot muscles. - Advise taking it easy for the next few  days. - Consider immobilization or physical therapy if no improvement in 2-3 weeks.  Diabetes mellitus, type unspecified Diabetes mellitus, type unspecified with no current symptoms of neuropathy. Potential for elevated blood sugar following steroid injection discussed. - Monitor blood sugar levels, especially after steroid  injection.   Procedure: Injection Tendon/Ligament right Discussed alternatives, risks, complications and verbal consent was obtained.  Location: Right heel plantar fascia, plantar medial approach. Skin Prep: Alcohol. Injectate: 0.5 cc 0.5% marcaine plain, 0.5 cc of 2% lidocaine plain, 1 cc kenalog   Disposition: Patient tolerated procedure well. Injection site dressed with a band-aid.  Post-injection care was discussed and return precautions discussed.        Return in about 3 weeks (around 12/30/2023) for Plantar Fasciitis.

## 2023-12-30 ENCOUNTER — Encounter: Payer: Self-pay | Admitting: Podiatry

## 2023-12-30 ENCOUNTER — Ambulatory Visit: Admitting: Podiatry

## 2023-12-30 DIAGNOSIS — M62461 Contracture of muscle, right lower leg: Secondary | ICD-10-CM | POA: Diagnosis not present

## 2023-12-30 DIAGNOSIS — M722 Plantar fascial fibromatosis: Secondary | ICD-10-CM | POA: Diagnosis not present

## 2023-12-30 MED ORDER — TRIAMCINOLONE ACETONIDE 10 MG/ML IJ SUSP
10.0000 mg | Freq: Once | INTRAMUSCULAR | Status: AC
  Administered 2023-12-30: 10 mg

## 2023-12-30 NOTE — Progress Notes (Signed)
  Subjective:  Patient ID: Anthony Bishop, male    DOB: 23-Nov-1970,  MRN: [id number]  Chief Complaint  Patient presents with   Plantar Fasciitis    A1c 7.2 in Sep. No anti coag.  PF follow up to the right foot. Reports today that he is some better. Not hurting as much when walking, but is still painful in the mornings and after rest. Injection did not help.     Discussed the use of AI scribe software for clinical note transcription with the patient, who gave verbal consent to proceed.  History of Present Illness Anthony Bishop is a 53 year old male who presents with persistent heel pain due to plantar fasciitis.  He experiences significant pain in the right heel, especially upon waking and after prolonged sitting. The pain is severe initially but improves with movement. Previously, the pain radiated to the ankle, but this is no longer the case.  He uses powerstep inserts and has transitioned out of work boots, which has provided some relief. He performs stretching exercises regularly. He has not picked up meloxicam due to previous ineffectiveness.  He declined an injection today due to insurance issues but has previously found some relief from it.      Objective:    Physical Exam EXTREMITIES: Pain in plantar central and lateral heel. Gastrocnemius equinus present in right lower extremity. DP and PT pulses palpable 2/4. Capillary refill intact. No significant edema. MUSCULOSKELETAL: High arch cavus foot, tight. NEUROLOGICAL: Negative Tinel's sign, protective and light touch sensation intact. SKIN: Pedal skin well hydrated, normal texture and turgor.   No images are attached to the encounter.    Results    Assessment:   1. Plantar fasciitis of right foot   2. Gastrocnemius equinus, right      Plan:  Patient was evaluated and treated and all questions answered.  Assessment and Plan Assessment & Plan Plantar fasciitis of right foot Chronic plantar  fasciitis with pain in plantar central and lateral heel. Previous injection provided some relief. Not taking meloxicam due to perceived ineffectiveness. - Recommend night splint to reduce morning pain. This was fitted and dispensed today. This is a static AFO device with soft interface material to be worn when sleeping or nonweightbearing. - Advise continuation of stretching exercises and supportive footwear. - Instruct to take OTC naproxen or Aleve twice daily for two weeks.  Continue as needed thereon after - Avoid going barefoot; use house shoes. - Consider future injection if insurance issues resolve and symptoms persist. - Low back pain as potential aggravating factor - May need to consider period of cam boot immobilization or PT if there is not adequate progression.   Gastrocnemius equinus of right lower leg Gastrocnemius equinus contributing to plantar fasciitis. - Continue stretching exercises for gastrocnemius muscle. - Discuss potential need for physical therapy if no improvement.  Follow-Up Follow-up to assess progress and response to treatment. - Schedule follow-up in 3 to 4 weeks to evaluate improvement and discuss further management.      Return in about 4 weeks (around 01/27/2024) for Plantar Fasciitis.

## 2023-12-30 NOTE — Patient Instructions (Signed)

## 2024-01-27 ENCOUNTER — Ambulatory Visit: Admitting: Podiatry
